# Patient Record
Sex: Male | Born: 1980 | Race: Black or African American | Hispanic: No | Marital: Single | Smoking: Current every day smoker
Health system: Southern US, Community
[De-identification: ages and names within clinical notes are randomized; demographics above are authoritative.]

## PROBLEM LIST (undated history)

## (undated) DIAGNOSIS — I10 Essential (primary) hypertension: Secondary | ICD-10-CM

---

## 2009-03-31 ENCOUNTER — Emergency Department: Payer: Self-pay | Admitting: Unknown Physician Specialty

## 2011-12-21 ENCOUNTER — Emergency Department: Payer: Self-pay | Admitting: Emergency Medicine

## 2011-12-21 LAB — BASIC METABOLIC PANEL
BUN: 18 mg/dL (ref 7–18)
Calcium, Total: 10.2 mg/dL — ABNORMAL HIGH (ref 8.5–10.1)
Chloride: 103 mmol/L (ref 98–107)
Co2: 22 mmol/L (ref 21–32)
Creatinine: 2.15 mg/dL — ABNORMAL HIGH (ref 0.60–1.30)
EGFR (African American): 46 — ABNORMAL LOW
EGFR (Non-African Amer.): 40 — ABNORMAL LOW
Potassium: 3.5 mmol/L (ref 3.5–5.1)
Sodium: 138 mmol/L (ref 136–145)

## 2011-12-21 LAB — CK: CK, Total: 553 U/L — ABNORMAL HIGH (ref 35–232)

## 2011-12-21 LAB — CBC WITH DIFFERENTIAL/PLATELET
Basophil #: 0 10*3/uL (ref 0.0–0.1)
Eosinophil %: 0 %
HCT: 45.2 % (ref 40.0–52.0)
Lymphocyte #: 0.9 10*3/uL — ABNORMAL LOW (ref 1.0–3.6)
MCH: 28.6 pg (ref 26.0–34.0)
MCHC: 33.1 g/dL (ref 32.0–36.0)
MCV: 86 fL (ref 80–100)
Monocyte %: 5.7 %
Neutrophil #: 15.8 10*3/uL — ABNORMAL HIGH (ref 1.4–6.5)
RBC: 5.23 10*6/uL (ref 4.40–5.90)
RDW: 14.1 % (ref 11.5–14.5)
WBC: 17.8 10*3/uL — ABNORMAL HIGH (ref 3.8–10.6)

## 2012-11-29 ENCOUNTER — Emergency Department: Payer: Self-pay | Admitting: Emergency Medicine

## 2012-12-08 ENCOUNTER — Emergency Department: Payer: Self-pay | Admitting: Emergency Medicine

## 2015-04-26 ENCOUNTER — Emergency Department
Admission: EM | Admit: 2015-04-26 | Discharge: 2015-04-26 | Disposition: A | Discharge status: Home / Self Care | Payer: BLUE CROSS/BLUE SHIELD | Attending: Emergency Medicine | Admitting: Emergency Medicine

## 2015-04-26 ENCOUNTER — Encounter: Payer: Self-pay | Admitting: Emergency Medicine

## 2015-04-26 DIAGNOSIS — F17210 Nicotine dependence, cigarettes, uncomplicated: Secondary | ICD-10-CM | POA: Diagnosis not present

## 2015-04-26 DIAGNOSIS — R05 Cough: Secondary | ICD-10-CM | POA: Diagnosis present

## 2015-04-26 DIAGNOSIS — J111 Influenza due to unidentified influenza virus with other respiratory manifestations: Secondary | ICD-10-CM | POA: Diagnosis not present

## 2015-04-26 MED ORDER — IBUPROFEN 800 MG PO TABS
800.0000 mg | ORAL_TABLET | Freq: Three times a day (TID) | ORAL | Status: DC | PRN
Start: 2015-04-26 — End: 2018-04-22

## 2015-04-26 MED ORDER — PROMETHAZINE-CODEINE 6.25-10 MG/5ML PO SYRP: 7.5000 mL | ORAL_SOLUTION | Freq: Four times a day (QID) | ORAL | Status: DC | PRN

## 2015-04-26 NOTE — ED Provider Notes (Signed)
[facility] Emergency Department Provider Note  ____________________________________________  Time seen: Approximately 9:19 AM  I have reviewed the triage vital signs and the nursing notes.   HISTORY  Chief Complaint Generalized Body Aches; Cough; Sore Throat; and Nasal Congestion    HPI Antonio Mcclain is a 34 y.o. male who presents to the emergency department for evaluation of sore throat, body aches, cough, and chills since Friday evening. He has been taking Robitussin without relief.   History reviewed. No pertinent past medical history.  There are no active problems to display for this patient.   History reviewed. No pertinent past surgical history.  Current Outpatient Rx  Name  Route  Sig  Dispense  Refill  . ibuprofen (ADVIL,MOTRIN) 800 MG tablet   Oral   Take 1 tablet (800 mg total) by mouth every 8 (eight) hours as needed.   30 tablet   0   . promethazine-codeine (PHENERGAN WITH CODEINE) 6.25-10 MG/5ML syrup   Oral   Take 7.5 mLs by mouth every 6 (six) hours as needed for cough.   120 mL   0     Allergies Coconut oil and Tramadol  No family history on file.  Social History Social History  Substance Use Topics  . Smoking status: Current Every Day Smoker -- 0.50 packs/day for 16 years    Types: Cigarettes  . Smokeless tobacco: None  . Alcohol Use: Yes     Comment: occasionally    Review of Systems Constitutional: Positive for fever/chills Eyes: No visual changes. ENT: Positive for sore throat. Cardiovascular: Denies chest pain. Respiratory: Denies shortness of breath. Gastrointestinal: No abdominal pain.  Nausea, no vomiting. Diarrhea yesterday.  No constipation. Genitourinary: Negative for dysuria. Musculoskeletal: Positive for body aches Skin: Negative for rash. Neurological: Negative for headaches, focal weakness or numbness.  10-point ROS otherwise  negative.  ____________________________________________   PHYSICAL EXAM:  VITAL SIGNS: ED Triage Vitals  Enc Vitals Group     BP 04/26/15 0827 134/95 mmHg     Pulse Rate 04/26/15 0827 79     Resp 04/26/15 0827 20     Temp 04/26/15 0827 98.4 F (36.9 C)     Temp Source 04/26/15 0827 Oral     SpO2 04/26/15 0827 99 %     Weight 04/26/15 0827 185 lb (83.915 kg)     Height 04/26/15 0827 6\' 3"  (1.905 m)     Head Cir --      Peak Flow --      Pain Score 04/26/15 0829 9     Pain Loc --      Pain Edu? --      Excl. in GC? --     Constitutional: Alert and oriented. Well appearing and in no acute distress. Eyes: Conjunctivae are normal. PERRL. EOMI. Head: Atraumatic. Nose: No congestion/rhinnorhea. Mouth/Throat: Mucous membranes are moist.  Oropharynx erythematous. Neck: No stridor.   Cardiovascular: Normal rate, regular rhythm. Grossly normal heart sounds.  Good peripheral circulation. Respiratory: Normal respiratory effort.  No retractions. Lungs CTAB. Gastrointestinal: Soft and nontender. No distention. No abdominal bruits. No CVA tenderness. Musculoskeletal: No lower extremity tenderness nor edema.  No joint effusions.  Neurologic:  Normal speech and language. No gross focal neurologic deficits are appreciated. No gait instability. Skin:  Skin is warm, dry and intact. No rash noted. Diaphoretic. Psychiatric: Mood and affect are normal. Speech and behavior are normal.  ____________________________________________   LABS (all labs ordered are listed, but only abnormal results are  displayed)  Labs Reviewed - No data to display ____________________________________________  EKG   ____________________________________________  RADIOLOGY   ____________________________________________   PROCEDURES  Procedure(s) performed: None  Critical Care performed: No  ____________________________________________   INITIAL IMPRESSION / ASSESSMENT AND PLAN / ED  COURSE  Pertinent labs & imaging results that were available during my care of the patient were reviewed by me and considered in my medical decision making (see chart for details).  Patient advised to follow up with PCP for symptoms that are not improving over the week. He was advised to return to the ER for symptoms that change or worsen if unable to schedule an appointment. ____________________________________________   FINAL CLINICAL IMPRESSION(S) / ED DIAGNOSES  Final diagnoses:  Influenza      Chinita Pester, FNP 04/26/15 [id number]  Emily Filbert, MD 04/26/15 1322

## 2015-04-26 NOTE — ED Notes (Signed)
Complaining of sore throat, upper body aches, brownish-green mucus coughing up. Wearing face mask.

## 2015-04-26 NOTE — Discharge Instructions (Signed)

## 2015-07-12 ENCOUNTER — Encounter: Payer: Self-pay | Admitting: Emergency Medicine

## 2015-07-12 ENCOUNTER — Emergency Department
Admission: EM | Admit: 2015-07-12 | Discharge: 2015-07-12 | Disposition: A | Discharge status: Home / Self Care | Payer: BLUE CROSS/BLUE SHIELD | Attending: Student | Admitting: Student

## 2015-07-12 DIAGNOSIS — H6123 Impacted cerumen, bilateral: Secondary | ICD-10-CM

## 2015-07-12 DIAGNOSIS — F17210 Nicotine dependence, cigarettes, uncomplicated: Secondary | ICD-10-CM | POA: Insufficient documentation

## 2015-07-12 MED ORDER — OXYCODONE-ACETAMINOPHEN 5-325 MG PO TABS
1.0000 | ORAL_TABLET | Freq: Once | ORAL | Status: AC
  Administered 2015-07-12: 1 via ORAL
  Filled 2015-07-12: qty 1

## 2015-07-12 MED ORDER — IBUPROFEN 800 MG PO TABS
800.0000 mg | ORAL_TABLET | Freq: Once | ORAL | Status: AC
  Administered 2015-07-12: 800 mg via ORAL
  Filled 2015-07-12: qty 1

## 2015-07-12 NOTE — ED Provider Notes (Signed)
[facility] Emergency Department Provider Note  ____________________________________________  Time seen: Approximately 9:18 PM  I have reviewed the triage vital signs and the nursing notes.   HISTORY  Chief Complaint Otalgia    HPI Antonio Mcclain is a 35 y.o. male patient complaining of bilateral ear pain left greater than right which started approximately 3/2 hours ago. Further history shows that the patient had one week of mild pain with decreased hearing loss. Patient state he used Q-tips to clean the ears about a week ago. Patient denies any URI signs symptoms. No palliative measures taken for this complaint. Patient is rating the pain as a 10 over 10.  History reviewed. No pertinent past medical history.  There are no active problems to display for this patient.   History reviewed. No pertinent past surgical history.  Current Outpatient Rx  Name  Route  Sig  Dispense  Refill  . ibuprofen (ADVIL,MOTRIN) 800 MG tablet   Oral   Take 1 tablet (800 mg total) by mouth every 8 (eight) hours as needed.   30 tablet   0   . promethazine-codeine (PHENERGAN WITH CODEINE) 6.25-10 MG/5ML syrup   Oral   Take 7.5 mLs by mouth every 6 (six) hours as needed for cough.   120 mL   0     Allergies Coconut oil and Tramadol  No family history on file.  Social History Social History  Substance Use Topics  . Smoking status: Current Every Day Smoker -- 0.50 packs/day for 16 years    Types: Cigarettes  . Smokeless tobacco: None  . Alcohol Use: Yes     Comment: occasionally    Review of Systems Constitutional: No fever/chills Eyes: No visual changes. ENT: No sore throat. Bilateral ear pain left greater than right Cardiovascular: Denies chest pain. Respiratory: Denies shortness of breath. Gastrointestinal: No abdominal pain.  No nausea, no vomiting.  No diarrhea.  No constipation. Genitourinary: Negative for dysuria. Musculoskeletal: Negative for back  pain. Skin: Negative for rash. Neurological: Negative for headaches, focal weakness or numbness. 10-point ROS otherwise negative.  ____________________________________________   PHYSICAL EXAM:  VITAL SIGNS: ED Triage Vitals  Enc Vitals Group     BP 07/12/15 2056 148/90 mmHg     Pulse Rate 07/12/15 2056 76     Resp 07/12/15 2056 16     Temp 07/12/15 2056 98.7 F (37.1 C)     Temp Source 07/12/15 2056 Oral     SpO2 07/12/15 2056 99 %     Weight 07/12/15 2056 185 lb (83.915 kg)     Height 07/12/15 2056  (1.88 m)     Head Cir --      Peak Flow --      Pain Score 07/12/15 2056 10     Pain Loc --      Pain Edu? --      Excl. in GC? --     Constitutional: Alert and oriented. Well appearing and in no acute distress. Eyes: Conjunctivae are normal. PERRL. EOMI. Head: Atraumatic. Nose: No congestion/rhinnorhea. Mouth/Throat: Mucous membranes are moist.  Oropharynx non-erythematous.  EAR: Bilateral ears feel of wax TM is not visible. Neck: No stridor.  No cervical spine tenderness to palpation. Hematological/Lymphatic/Immunilogical: No cervical lymphadenopathy. Cardiovascular: Normal rate, regular rhythm. Grossly normal heart sounds.  Good peripheral circulation. Respiratory: Normal respiratory effort.  No retractions. Lungs CTAB. Gastrointestinal: Soft and nontender. No distention. No abdominal bruits. No CVA tenderness. Musculoskeletal: No lower extremity tenderness nor edema.  No joint effusions. Neurologic:  Normal speech and language. No gross focal neurologic deficits are appreciated. No gait instability. Skin:  Skin is warm, dry and intact. No rash noted. Psychiatric: Mood and affect are normal. Speech and behavior are normal.  ____________________________________________   LABS (all labs ordered are listed, but only abnormal results are displayed)  Labs Reviewed - No data to  display ____________________________________________  EKG   ____________________________________________  RADIOLOGY   ____________________________________________   PROCEDURES  Procedure(s) performed: None  Critical Care performed: No  ____________________________________________   INITIAL IMPRESSION / ASSESSMENT AND PLAN / ED COURSE  Pertinent labs & imaging results that were available during my care of the patient were reviewed by me and considered in my medical decision making (see chart for details).  Cerumen impaction. Patient is discharged care instructions. Patient [facility] ____________________________________________   FINAL CLINICAL IMPRESSION(S) / ED DIAGNOSES  Final diagnoses:  Cerumen impaction, bilateral      Joni Reining, PA-C 07/12/15 2218  Gayla Doss, MD 07/13/15 [id number]

## 2015-07-12 NOTE — ED Notes (Signed)
Pt states around 1800 pain began in left ear and is throbbing and tingling thought left side of head. Denies fever. Pt A&O

## 2015-07-12 NOTE — Discharge Instructions (Signed)
Cerumen Impaction The structures of the external ear canal secrete a waxy substance known as cerumen. Excess cerumen can build up in the ear canal, causing a condition known as cerumen impaction. Cerumen impaction can cause ear pain and disrupt the function of the ear. The rate of cerumen production differs for each individual. In certain individuals, the configuration of the ear canal may decrease his or her ability to naturally remove cerumen. CAUSES Cerumen impaction is caused by excessive cerumen production or buildup. RISK FACTORS  Frequent use of swabs to clean ears.  Having narrow ear canals.  Having eczema.  Being dehydrated. SIGNS AND SYMPTOMS  Diminished hearing.  Ear drainage.  Ear pain.  Ear itch. TREATMENT Treatment may involve:  Over-the-counter or prescription ear drops to soften the cerumen.  Removal of cerumen by a health care provider. This may be done with:  Irrigation with warm water. This is the most common method of removal.  Ear curettes and other instruments.  Surgery. This may be done in severe cases. HOME CARE INSTRUCTIONS  Take medicines only as directed by your health care provider.  Do not insert objects into the ear with the intent of cleaning the ear. PREVENTION  Do not insert objects into the ear, even with the intent of cleaning the ear. Removing cerumen as a part of normal hygiene is not necessary, and the use of swabs in the ear canal is not recommended.  Drink enough water to keep your urine clear or pale yellow.  Control your eczema if you have it. SEEK MEDICAL CARE IF:  You develop ear pain.  You develop bleeding from the ear.  The cerumen does not clear after you use ear drops as directed.   This information is not intended to replace advice given to you by your health care provider. Make sure you discuss any questions you have with your health care provider.   Document Released: 06/29/2004 Document Revised: 06/12/2014  Document Reviewed: 01/06/2015 Elsevier Interactive Patient Education 2016 [employer] Inc[employer]  

## 2015-07-12 NOTE — ED Notes (Signed)
Pt c/o otalgia in left ear that started around 1800.Pain radiates throughout left side of head with tingling and throbbing. Pt denies any fever . Pt A&O

## 2015-11-02 ENCOUNTER — Emergency Department
Admission: EM | Admit: 2015-11-02 | Discharge: 2015-11-02 | Disposition: A | Discharge status: Home / Self Care | Payer: PRIVATE HEALTH INSURANCE | Attending: Emergency Medicine | Admitting: Emergency Medicine

## 2015-11-02 ENCOUNTER — Encounter: Payer: Self-pay | Admitting: Medical Oncology

## 2015-11-02 DIAGNOSIS — Z791 Long term (current) use of non-steroidal anti-inflammatories (NSAID): Secondary | ICD-10-CM | POA: Diagnosis not present

## 2015-11-02 DIAGNOSIS — J069 Acute upper respiratory infection, unspecified: Secondary | ICD-10-CM | POA: Diagnosis not present

## 2015-11-02 DIAGNOSIS — Z79899 Other long term (current) drug therapy: Secondary | ICD-10-CM | POA: Insufficient documentation

## 2015-11-02 DIAGNOSIS — R05 Cough: Secondary | ICD-10-CM | POA: Diagnosis present

## 2015-11-02 DIAGNOSIS — F17210 Nicotine dependence, cigarettes, uncomplicated: Secondary | ICD-10-CM | POA: Insufficient documentation

## 2015-11-02 MED ORDER — IPRATROPIUM-ALBUTEROL 0.5-2.5 (3) MG/3ML IN SOLN
3.0000 mL | Freq: Once | RESPIRATORY_TRACT | Status: AC
  Administered 2015-11-02: 3 mL via RESPIRATORY_TRACT
  Filled 2015-11-02: qty 3

## 2015-11-02 NOTE — ED Notes (Signed)
Pt reports cold symptoms and cough with chest congestion since Thursday. Productive cough.

## 2015-11-02 NOTE — Discharge Instructions (Signed)
Upper Respiratory Infection, Adult Most upper respiratory infections (URIs) are a viral infection of the air passages leading to the lungs. A URI affects the nose, throat, and upper air passages. The most common type of URI is nasopharyngitis and is typically referred to as "the common cold." URIs run their course and usually go away on their own. Most of the time, a URI does not require medical attention, but sometimes a bacterial infection in the upper airways can follow a viral infection. This is called a secondary infection. Sinus and middle ear infections are common types of secondary upper respiratory infections. Bacterial pneumonia can also complicate a URI. A URI can worsen asthma and chronic obstructive pulmonary disease (COPD). Sometimes, these complications can require emergency medical care and may be life threatening.  CAUSES Almost all URIs are caused by viruses. A virus is a type of germ and can spread from one person to another.  RISKS FACTORS You may be at risk for a URI if:   You smoke.   You have chronic heart or lung disease.  You have a weakened defense (immune) system.   You are very young or very old.   You have nasal allergies or asthma.  You work in crowded or poorly ventilated areas.  You work in health care facilities or schools. SIGNS AND SYMPTOMS  Symptoms typically develop 2-3 days after you come in contact with a cold virus. Most viral URIs last 7-10 days. However, viral URIs from the influenza virus (flu virus) can last 14-18 days and are typically more severe. Symptoms may include:   Runny or stuffy (congested) nose.   Sneezing.   Cough.   Sore throat.   Headache.   Fatigue.   Fever.   Loss of appetite.   Pain in your forehead, behind your eyes, and over your cheekbones (sinus pain).  Muscle aches.  DIAGNOSIS  Your health care provider may diagnose a URI by:  Physical exam.  Tests to check that your symptoms are not due to  another condition such as:  Strep throat.  Sinusitis.  Pneumonia.  Asthma. TREATMENT  A URI goes away on its own with time. It cannot be cured with medicines, but medicines may be prescribed or recommended to relieve symptoms. Medicines may help:  Reduce your fever.  Reduce your cough.  Relieve nasal congestion. HOME CARE INSTRUCTIONS   Take medicines only as directed by your health care provider.   Gargle warm saltwater or take cough drops to comfort your throat as directed by your health care provider.  Use a warm mist humidifier or inhale steam from a shower to increase air moisture. This may make it easier to breathe.  Drink enough fluid to keep your urine clear or pale yellow.   Eat soups and other clear broths and maintain good nutrition.   Rest as needed.   Return to work when your temperature has returned to normal or as your health care provider advises. You may need to stay home longer to avoid infecting others. You can also use a face mask and careful hand washing to prevent spread of the virus.  Increase the usage of your inhaler if you have asthma.   Do not use any tobacco products, including cigarettes, chewing tobacco, or electronic cigarettes. If you need help quitting, ask your health care provider. PREVENTION  The best way to protect yourself from getting a cold is to practice good hygiene.   Avoid oral or hand contact with people with cold   symptoms.   Wash your hands often if contact occurs.  There is no clear evidence that vitamin C, vitamin E, echinacea, or exercise reduces the chance of developing a cold. However, it is always recommended to get plenty of rest, exercise, and practice good nutrition.  SEEK MEDICAL CARE IF:   You are getting worse rather than better.   Your symptoms are not controlled by medicine.   You have chills.  You have worsening shortness of breath.  You have brown or red mucus.  You have yellow or brown nasal  discharge.  You have pain in your face, especially when you bend forward.  You have a fever.  You have swollen neck glands.  You have pain while swallowing.  You have white areas in the back of your throat. SEEK IMMEDIATE MEDICAL CARE IF:   You have severe or persistent:  Headache.  Ear pain.  Sinus pain.  Chest pain.  You have chronic lung disease and any of the following:  Wheezing.  Prolonged cough.  Coughing up blood.  A change in your usual mucus.  You have a stiff neck.  You have changes in your:  Vision.  Hearing.  Thinking.  Mood. MAKE SURE YOU:   Understand these instructions.  Will watch your condition.  Will get help right away if you are not doing well or get worse.   This information is not intended to replace advice given to you by your health care provider. Make sure you discuss any questions you have with your health care provider.   Document Released: 11/15/2000 Document Revised: 10/06/2014 Document Reviewed: 08/27/2013 Elsevier Interactive Patient Education 2016 Elsevier Inc.  

## 2015-11-02 NOTE — ED Notes (Signed)
See triage   Cold .cough and congestion for about 4 days    States cough is prod  And having some discomfort to chest with cough  Afebrile on arrival

## 2015-11-04 NOTE — ED Provider Notes (Signed)
[facility] Emergency Department Provider Note  ____________________________________________  Time seen: On arrival  I have reviewed the triage vital signs and the nursing notes.   HISTORY  Chief Complaint URI and Cough    HPI EUAL LINDSTROM is a 35 y.o. male who presents with complaints of congestion, cough, fatigue for approximately 3-4 days. He does report a productive cough. He denies chest pain to me. He denies shortness of breath to me. He does smoke but reports she is trying to quit. No recent travel. No calf pain or swelling. No pleurisy.    History reviewed. No pertinent past medical history.  There are no active problems to display for this patient.   History reviewed. No pertinent past surgical history.  Current Outpatient Rx  Name  Route  Sig  Dispense  Refill  . ibuprofen (ADVIL,MOTRIN) 800 MG tablet   Oral   Take 1 tablet (800 mg total) by mouth every 8 (eight) hours as needed.   30 tablet   0   . promethazine-codeine (PHENERGAN WITH CODEINE) 6.25-10 MG/5ML syrup   Oral   Take 7.5 mLs by mouth every 6 (six) hours as needed for cough.   120 mL   0     Allergies Coconut oil and Tramadol  No family history on file.  Social History Social History  Substance Use Topics  . Smoking status: Current Every Day Smoker -- 0.50 packs/day for 16 years    Types: Cigarettes  . Smokeless tobacco: None  . Alcohol Use: Yes     Comment: occasionally    Review of Systems  Constitutional: Subjective fevers Eyes: Negative for visual changes. ENT: Negative for sore throat    Musculoskeletal: Negative for back pain. No calf pain or swelling Skin: Negative for rash. Neurological: Negative for headaches or focal weakness   ____________________________________________   PHYSICAL EXAM:  VITAL SIGNS: ED Triage Vitals  Enc Vitals Group     BP 11/02/15 0934 140/94 mmHg     Pulse Rate 11/02/15 0934 69     Resp 11/02/15 0934 18     Temp 11/02/15 0934 98.4 F (36.9 C)     Temp Source 11/02/15 0934 Oral     SpO2 11/02/15 0934 100 %     Weight 11/02/15 0934 185 lb (83.915 kg)     Height 11/02/15 0934  (1.905 m)     Head Cir --      Peak Flow --      Pain Score 11/02/15 0938 9     Pain Loc --      Pain Edu? --      Excl. in GC? --     Constitutional: Alert and oriented. Well appearing and in no distress. Eyes: Conjunctivae are normal.  ENT   Head: Normocephalic and atraumatic.   Mouth/Throat: Mucous membranes are moist. Cardiovascular: Normal rate, regular rhythm.  Respiratory: Normal respiratory effort without tachypnea nor retractions.  Gastrointestinal: Soft and non-tender in all quadrants. No distention. There is no CVA tenderness. Musculoskeletal: Nontender with normal range of motion in all extremities. Neurologic:  Normal speech and language. No gross focal neurologic deficits are appreciated. Skin:  Skin is warm, dry and intact. No rash noted. Psychiatric: Mood and affect are normal. Patient exhibits appropriate insight and judgment.  ____________________________________________    LABS (pertinent positives/negatives)  Labs Reviewed - No data to display  ____________________________________________     ____________________________________________    RADIOLOGY I have personally reviewed any xrays that were  ordered on this patient: Non  ____________________________________________   PROCEDURES  Procedure(s) performed: none   ____________________________________________   INITIAL IMPRESSION / ASSESSMENT AND PLAN / ED COURSE  Pertinent labs & imaging results that were available during my care of the patient were reviewed by me and considered in my medical decision making (see chart for details).  Patient is well-appearing and in no distress. His exam is benign. History of present illness is most consistent with viral upper respiratory infection. Recommend supportive  care and smoking cessation. Recommend outpatient PCP follow-up as needed if no improvement within one week. Return precautions discussed.  ____________________________________________   FINAL CLINICAL IMPRESSION(S) / ED DIAGNOSES  Final diagnoses:  Upper respiratory infection     Jene Everyobert Santia Labate, MD 11/04/15 [phone]

## 2015-12-01 DIAGNOSIS — F17210 Nicotine dependence, cigarettes, uncomplicated: Secondary | ICD-10-CM | POA: Insufficient documentation

## 2015-12-01 DIAGNOSIS — L02213 Cutaneous abscess of chest wall: Secondary | ICD-10-CM | POA: Insufficient documentation

## 2015-12-02 ENCOUNTER — Emergency Department
Admission: EM | Admit: 2015-12-02 | Discharge: 2015-12-02 | Disposition: A | Discharge status: Home / Self Care | Payer: BLUE CROSS/BLUE SHIELD | Attending: Emergency Medicine | Admitting: Emergency Medicine

## 2015-12-02 ENCOUNTER — Encounter: Payer: Self-pay | Admitting: Emergency Medicine

## 2015-12-02 DIAGNOSIS — J869 Pyothorax without fistula: Secondary | ICD-10-CM

## 2015-12-02 MED ORDER — BACITRACIN ZINC 500 UNIT/GM EX OINT
TOPICAL_OINTMENT | Freq: Every day | CUTANEOUS | Status: AC
  Administered 2015-12-02: 1 via TOPICAL

## 2015-12-02 MED ORDER — OXYCODONE-ACETAMINOPHEN 5-325 MG PO TABS
ORAL_TABLET | ORAL | Status: AC
  Administered 2015-12-02: 1 via ORAL
  Filled 2015-12-02: qty 1

## 2015-12-02 MED ORDER — CEPHALEXIN 500 MG PO CAPS: 500.0000 mg | ORAL_CAPSULE | Freq: Two times a day (BID) | ORAL | Status: AC

## 2015-12-02 MED ORDER — OXYCODONE-ACETAMINOPHEN 5-325 MG PO TABS
1.0000 | ORAL_TABLET | Freq: Once | ORAL | Status: AC
  Administered 2015-12-02: 1 via ORAL

## 2015-12-02 MED ORDER — OXYCODONE-ACETAMINOPHEN 5-325 MG PO TABS: 1.0000 | ORAL_TABLET | ORAL | Status: DC | PRN

## 2015-12-02 MED ORDER — CEPHALEXIN 500 MG PO CAPS
500.0000 mg | ORAL_CAPSULE | Freq: Once | ORAL | Status: AC
  Administered 2015-12-02: 500 mg via ORAL
  Filled 2015-12-02: qty 1

## 2015-12-02 MED ORDER — LIDOCAINE HCL (PF) 1 % IJ SOLN
5.0000 mL | Freq: Once | INTRAMUSCULAR | Status: AC
  Administered 2015-12-02: 02:00:00 via INTRADERMAL

## 2015-12-02 MED ORDER — BACITRACIN ZINC 500 UNIT/GM EX OINT
TOPICAL_OINTMENT | CUTANEOUS | Status: AC
  Administered 2015-12-02: 1 via TOPICAL
  Filled 2015-12-02: qty 0.9

## 2015-12-02 MED ORDER — LIDOCAINE HCL (PF) 1 % IJ SOLN
INTRAMUSCULAR | Status: AC
Start: 2015-12-02 — End: 2015-12-02
  Filled 2015-12-02: qty 5

## 2015-12-02 NOTE — ED Provider Notes (Signed)
[facility] Emergency Department Provider Note  ____________________________________________  Time seen: 1:50 AM  I have reviewed the triage vital signs and the nursing notes.   HISTORY  Chief Complaint Abscess      HPI Antonio Mcclain is a 35 y.o. male presents with "abscess on his chest 5 days. Patient states the area was initially a pimple which he attempted to "pop". Following this the patient stated that the area became more swollen and tender to touch. Patient denies any fever   Past medical history No pertinent past medical history  There are no active problems to display for this patient.   Past surgical history No pertinent past surgical history  Current Outpatient Rx  Name  Route  Sig  Dispense  Refill  . ibuprofen (ADVIL,MOTRIN) 800 MG tablet   Oral   Take 1 tablet (800 mg total) by mouth every 8 (eight) hours as needed.   30 tablet   0   . promethazine-codeine (PHENERGAN WITH CODEINE) 6.25-10 MG/5ML syrup   Oral   Take 7.5 mLs by mouth every 6 (six) hours as needed for cough.   120 mL   0     Allergies Coconut oil and Tramadol  History reviewed. No pertinent family history.  Social History Social History  Substance Use Topics  . Smoking status: Current Every Day Smoker -- 0.50 packs/day for 16 years    Types: Cigarettes  . Smokeless tobacco: Never Used  . Alcohol Use: Yes     Comment: occasionally    Review of Systems  Constitutional: Negative for fever. Eyes: Negative for visual changes. ENT: Negative for sore throat. Cardiovascular: Negative for chest pain. Respiratory: Negative for shortness of breath. Gastrointestinal: Negative for abdominal pain, vomiting and diarrhea. Genitourinary: Negative for dysuria. Musculoskeletal: Negative for back pain. Skin: Negative for rash.Positive for chest abscess Neurological: Negative for headaches, focal weakness or numbness.   10-point ROS otherwise  negative.  ____________________________________________   PHYSICAL EXAM:  VITAL SIGNS: ED Triage Vitals  Enc Vitals Group     BP 12/02/15 0016 125/82 mmHg     Pulse Rate 12/02/15 0016 64     Resp 12/02/15 0016 18     Temp 12/02/15 0016 98.1 F (36.7 C)     Temp Source 12/02/15 0016 Oral     SpO2 12/02/15 0016 99 %     Weight 12/02/15 0016 185 lb (83.915 kg)     Height 12/02/15 0016 6\' 3"  (1.905 m)     Head Cir --      Peak Flow --      Pain Score 12/02/15 0017 8     Pain Loc --      Pain Edu? --      Excl. in GC? --      Constitutional: Alert and oriented. Well appearing and in no distress. Eyes: Conjunctivae are normal. PERRL. Normal extraocular movements. ENT   Head: Normocephalic and atraumatic.   Nose: No congestion/rhinnorhea.   Mouth/Throat: Mucous membranes are moist.   Neck: No stridor. Hematological/Lymphatic/Immunilogical: No cervical lymphadenopathy. Cardiovascular: Normal rate, regular rhythm. Normal and symmetric distal pulses are present in all extremities. No murmurs, rubs, or gallops. Respiratory: Normal respiratory effort without tachypnea nor retractions. Breath sounds are clear and equal bilaterally. No wheezes/rales/rhonchi. Gastrointestinal: Soft and nontender. No distention. There is no CVA tenderness. Genitourinary: deferred Musculoskeletal: Nontender with normal range of motion in all extremities. No joint effusions.  No lower extremity tenderness nor edema. Neurologic:  Normal speech  and language. No gross focal neurologic deficits are appreciated. Speech is normal.  Skin:  Skin is warm, dry and intact. No rash noted.One by one flocculent raised area noted at his xiphoid process Psychiatric: Mood and affect are normal. Speech and behavior are normal. Patient exhibits appropriate insight and judgment.     PROCEDURES  Procedure(s) performed:   Marland Kitchen.Marland Kitchen.Incision and Drainage Date/Time: 12/02/2015 2:20 AM Performed by: Darci CurrentBROWN, Patriot  N Authorized by: Darci CurrentBROWN, [place] N Consent: Verbal consent obtained. Risks and benefits: risks, benefits and alternatives were discussed Consent given by: patient Patient understanding: patient states understanding of the procedure being performed Test results: test results available and properly labeled Required items: required blood products, implants, devices, and special equipment available Patient identity confirmed: verbally with patient and arm band Time out: Immediately prior to procedure a "time out" was called to verify the correct patient, procedure, equipment, support staff and site/side marked as required. Type: abscess Anesthesia: local infiltration Local anesthetic: lidocaine 1% without epinephrine Patient sedated: no Scalpel size: 11 Needle gauge: 22 Incision type: single straight Incision depth: subcutaneous Complexity: complex Drainage: purulent Drainage amount: moderate Wound treatment: wound left open Patient tolerance: Patient tolerated the procedure well with no immediate complications      INITIAL IMPRESSION / ASSESSMENT AND PLAN / ED COURSE  Pertinent labs & imaging results that were available during my care of the patient were reviewed by me and considered in my medical decision making (see chart for details).  Patient given Keflex and prescribed the same  ____________________________________________   FINAL CLINICAL IMPRESSION(S) / ED DIAGNOSES  Final diagnoses:  Abscess of chest [facility](HCC)      Darci Currentandolph N Weber Monnier, MD 12/02/15 [phone]

## 2015-12-02 NOTE — ED Notes (Signed)
Pt presents to ED with possible abscess on his chest. Pt states the other day he did pop it and the next day it was painful and more swollen. Area is not draining currently.

## 2015-12-02 NOTE — ED Notes (Signed)
Dressing and bacitracin applied to center of chest after MD lanced abscess - per MD order

## 2016-12-28 ENCOUNTER — Encounter: Payer: Self-pay | Admitting: Emergency Medicine

## 2016-12-28 ENCOUNTER — Emergency Department: Payer: PRIVATE HEALTH INSURANCE

## 2016-12-28 ENCOUNTER — Emergency Department
Admission: EM | Admit: 2016-12-28 | Discharge: 2016-12-28 | Disposition: A | Discharge status: Home / Self Care | Payer: PRIVATE HEALTH INSURANCE | Attending: Emergency Medicine | Admitting: Emergency Medicine

## 2016-12-28 DIAGNOSIS — S46812A Strain of other muscles, fascia and tendons at shoulder and upper arm level, left arm, initial encounter: Secondary | ICD-10-CM | POA: Insufficient documentation

## 2016-12-28 DIAGNOSIS — Y929 Unspecified place or not applicable: Secondary | ICD-10-CM | POA: Insufficient documentation

## 2016-12-28 DIAGNOSIS — S29012A Strain of muscle and tendon of back wall of thorax, initial encounter: Secondary | ICD-10-CM

## 2016-12-28 DIAGNOSIS — X500XXA Overexertion from strenuous movement or load, initial encounter: Secondary | ICD-10-CM | POA: Insufficient documentation

## 2016-12-28 DIAGNOSIS — F17210 Nicotine dependence, cigarettes, uncomplicated: Secondary | ICD-10-CM | POA: Insufficient documentation

## 2016-12-28 DIAGNOSIS — Y9389 Activity, other specified: Secondary | ICD-10-CM | POA: Diagnosis not present

## 2016-12-28 DIAGNOSIS — M25512 Pain in left shoulder: Secondary | ICD-10-CM | POA: Diagnosis present

## 2016-12-28 DIAGNOSIS — Y998 Other external cause status: Secondary | ICD-10-CM | POA: Diagnosis not present

## 2016-12-28 MED ORDER — ORPHENADRINE CITRATE 30 MG/ML IJ SOLN
60.0000 mg | Freq: Two times a day (BID) | INTRAMUSCULAR | Status: DC
  Filled 2016-12-28: qty 2

## 2016-12-28 MED ORDER — CYCLOBENZAPRINE HCL 10 MG PO TABS
10.0000 mg | ORAL_TABLET | Freq: Three times a day (TID) | ORAL | 0 refills | Status: AC | PRN
Start: 2016-12-28 — End: 2017-01-02

## 2016-12-28 MED ORDER — CYCLOBENZAPRINE HCL 10 MG PO TABS
10.0000 mg | ORAL_TABLET | Freq: Once | ORAL | Status: AC
  Administered 2016-12-28: 10 mg via ORAL
  Filled 2016-12-28: qty 1

## 2016-12-28 NOTE — ED Triage Notes (Signed)
Chronic left shoulder pain, irritation frequently , utilizes repetitive movement at work. "it hurts to breath"

## 2016-12-29 NOTE — ED Provider Notes (Signed)
[facility] Emergency Department Provider Note  ____________________________________________  Time seen: Approximately 5:40 PM  I have reviewed the triage vital signs and the nursing notes.   HISTORY  Chief Complaint Shoulder Pain    HPI Antonio Mcclain is a 36 y.o. male presenting to the emergency department with spasms of the latissimus dorsi, left. Patient states that his pain is 10 out of 10 in intensity and worsened with deep inspiration and with heavy lifting. Patient states that his pain is reproducible with palpation. He denies weakness, changes in sensation in the upper or lower extremities. Patient denies falls or mechanisms of trauma. He denies chest pain, chest tightness, shortness of breath, nausea, vomiting or abdominal pain.  History reviewed. No pertinent past medical history.  There are no active problems to display for this patient.   History reviewed. No pertinent surgical history.  Prior to Admission medications   Medication Sig Start Date End Date Taking? Authorizing Provider  cyclobenzaprine (FLEXERIL) 10 MG tablet Take 1 tablet (10 mg total) by mouth 3 (three) times daily as needed for muscle spasms. 12/28/16 01/02/17  Orvil FeilWoods, Andrika Peraza M, PA-C  ibuprofen (ADVIL,MOTRIN) 800 MG tablet Take 1 tablet (800 mg total) by mouth every 8 (eight) hours as needed. 04/26/15   Triplett, Rulon Eisenmengerari B, FNP  oxyCODONE-acetaminophen (PERCOCET/ROXICET) 5-325 MG tablet Take 1 tablet by mouth every 4 (four) hours as needed for severe pain. 12/02/15   Darci CurrentBrown, Cornish N, MD  promethazine-codeine [facility](PHENERGAN WITH CODEINE) 6.25-10 MG/5ML syrup Take 7.5 mLs by mouth every 6 (six) hours as needed for cough. 04/26/15   Triplett, Rulon Eisenmengerari B, FNP    Allergies Coconut oil and Tramadol  No family history on file.  Social History Social History  Substance Use Topics  . Smoking status: Current Every Day Smoker    Packs/day: 0.50    Years: 16.00    Types: Cigarettes  .  Smokeless tobacco: Never Used  . Alcohol use Yes     Comment: occasionally     Review of Systems  Constitutional: No fever/chills Eyes: No visual changes. No discharge ENT: No upper respiratory complaints. Cardiovascular: no chest pain. Respiratory: no cough. No SOB. Gastrointestinal: No abdominal pain.  No nausea, no vomiting.  No diarrhea.  No constipation. Genitourinary: Negative for dysuria. No hematuria Musculoskeletal: Patient has left sided upper back pain. Skin: Negative for rash, abrasions, lacerations, ecchymosis. Neurological: Negative for headaches, focal weakness or numbness.   ____________________________________________   PHYSICAL EXAM:  VITAL SIGNS: ED Triage Vitals  Enc Vitals Group     BP 12/28/16 1821 (!) 143/91     Pulse Rate 12/28/16 1821 69     Resp 12/28/16 1821 18     Temp 12/28/16 1821 98.4 F (36.9 C)     Temp Source 12/28/16 1821 Oral     SpO2 12/28/16 1821 100 %     Weight 12/28/16 1822 185 lb (83.9 kg)     Height 12/28/16 1822 6\' 3"  (1.905 m)     Head Circumference --      Peak Flow --      Pain Score 12/28/16 1821 10     Pain Loc --      Pain Edu? --      Excl. in GC? --      Constitutional: Alert and oriented. Well appearing and in no acute distress. Eyes: Conjunctivae are normal. PERRL. EOMI. Head: Atraumatic. Cardiovascular: Normal rate, regular rhythm. Normal S1 and S2.  Good peripheral circulation. Respiratory: Normal respiratory  effort without tachypnea or retractions. Lungs CTAB. Good air entry to the bases with no decreased or absent breath sounds. Musculoskeletal: Patient has 5/5 strength in the upper and lower extremities bilaterally. Full range of motion at the shoulder, elbow and wrist bilaterally. Full range of motion at the hip, knee and ankle bilaterally. Patient has pain with palpation along the left latissimus dorsi with palpation. Palpable radial, ulnar and dorsalis pedis pulses bilaterally and symmetrically. No changes  in gait.   Neurologic:  Normal speech and language. No gross focal neurologic deficits are appreciated.  Skin:  Skin is warm, dry and intact. No rash noted. Psychiatric: Mood and affect are normal. Speech and behavior are normal. Patient exhibits appropriate insight and judgement.   ____________________________________________   LABS (all labs ordered are listed, but only abnormal results are displayed)  Labs Reviewed - No data to display ____________________________________________  EKG   ____________________________________________  RADIOLOGY Geraldo PitterI, Jaci Desanto M Aditya Nastasi, personally viewed and evaluated these images (plain radiographs) as part of my medical decision making, as well as reviewing the written report by the radiologist.  Dg Thoracic Spine 2 View  Result Date: 12/28/2016 CLINICAL DATA:  Left shoulder pain extending to the mid back and down to T12 since this morning at 12 a.m. Hurts to breathe. Patient lifts heavy packages at work. EXAM: THORACIC SPINE 2 VIEWS COMPARISON:  None. FINDINGS: There is no evidence of thoracic spine fracture. Alignment is normal. No other significant bone abnormalities are identified. IMPRESSION: Negative. Electronically Signed   By: Burman NievesWilliam  Stevens M.D.   On: 12/28/2016 19:05    ____________________________________________    PROCEDURES  Procedure(s) performed:    Procedures    Medications  cyclobenzaprine (FLEXERIL) tablet 10 mg (10 mg Oral Given 12/28/16 1941)     ____________________________________________   INITIAL IMPRESSION / ASSESSMENT AND PLAN / ED COURSE  Pertinent labs & imaging results that were available during my care of the patient were reviewed by me and considered in my medical decision making (see chart for details).  Review of the [place] CSRS was performed in accordance of the [facility] prior to dispensing any controlled drugs.    Assessment and plan Strain of latissimus dorsi muscle, initial encounter Patient presents  to the emergency department with reproducible pain to palpation along the left latissimus dorsi. Patient was given Flexeril in the emergency department. He was discharged with Flexeril and Mobic. DG thoracic spine revealed no bony abnormality. Patient was advised to follow-up with primary care as needed. Vital signs are reassuring prior to discharge.   ____________________________________________  FINAL CLINICAL IMPRESSION(S) / ED DIAGNOSES  Final diagnoses:  Strain of latissimus dorsi muscle, initial encounter      NEW MEDICATIONS STARTED DURING THIS VISIT:  Discharge Medication List as of 12/28/2016  8:03 PM    START taking these medications   Details  cyclobenzaprine (FLEXERIL) 10 MG tablet Take 1 tablet (10 mg total) by mouth 3 (three) times daily as needed for muscle spasms., Starting Thu 12/28/2016, Until Tue 01/02/2017, Print            This chart was dictated using voice recognition software/Dragon. Despite best efforts to proofread, errors can occur which can change the meaning. Any change was purely unintentional.    Orvil FeilWoods, Orlin Kann M, PA-C 12/29/16 1746    Sharyn CreamerQuale, Mark, MD 01/04/17 [phone]

## 2017-02-22 ENCOUNTER — Encounter: Payer: Self-pay | Admitting: Emergency Medicine

## 2017-02-22 ENCOUNTER — Emergency Department
Admission: EM | Admit: 2017-02-22 | Discharge: 2017-02-22 | Disposition: A | Discharge status: Home / Self Care | Payer: PRIVATE HEALTH INSURANCE | Attending: Emergency Medicine | Admitting: Emergency Medicine

## 2017-02-22 DIAGNOSIS — J329 Chronic sinusitis, unspecified: Secondary | ICD-10-CM | POA: Insufficient documentation

## 2017-02-22 DIAGNOSIS — B9689 Other specified bacterial agents as the cause of diseases classified elsewhere: Secondary | ICD-10-CM | POA: Insufficient documentation

## 2017-02-22 DIAGNOSIS — F17210 Nicotine dependence, cigarettes, uncomplicated: Secondary | ICD-10-CM | POA: Insufficient documentation

## 2017-02-22 DIAGNOSIS — J40 Bronchitis, not specified as acute or chronic: Secondary | ICD-10-CM | POA: Insufficient documentation

## 2017-02-22 MED ORDER — AMOXICILLIN-POT CLAVULANATE 875-125 MG PO TABS: 1.0000 | ORAL_TABLET | Freq: Two times a day (BID) | ORAL | 0 refills | Status: DC

## 2017-02-22 MED ORDER — PREDNISONE 50 MG PO TABS: 50.0000 mg | ORAL_TABLET | Freq: Every day | ORAL | 0 refills | Status: DC

## 2017-02-22 MED ORDER — PSEUDOEPH-BROMPHEN-DM 30-2-10 MG/5ML PO SYRP: 10.0000 mL | ORAL_SOLUTION | Freq: Four times a day (QID) | ORAL | 0 refills | Status: DC | PRN

## 2017-02-22 MED ORDER — ALBUTEROL SULFATE (2.5 MG/3ML) 0.083% IN NEBU
2.5000 mg | INHALATION_SOLUTION | Freq: Once | RESPIRATORY_TRACT | Status: AC
  Administered 2017-02-22: 2.5 mg via RESPIRATORY_TRACT
  Filled 2017-02-22: qty 3

## 2017-02-22 MED ORDER — FLUTICASONE PROPIONATE 50 MCG/ACT NA SUSP: 1.0000 | Freq: Two times a day (BID) | NASAL | 0 refills | Status: DC

## 2017-02-22 MED ORDER — AMOXICILLIN-POT CLAVULANATE 875-125 MG PO TABS
1.0000 | ORAL_TABLET | Freq: Once | ORAL | Status: AC
  Administered 2017-02-22: 1 via ORAL
  Filled 2017-02-22: qty 1

## 2017-02-22 MED ORDER — ALBUTEROL SULFATE HFA 108 (90 BASE) MCG/ACT IN AERS: 2.0000 | INHALATION_SPRAY | RESPIRATORY_TRACT | 0 refills | Status: DC | PRN

## 2017-02-22 MED ORDER — METHYLPREDNISOLONE SODIUM SUCC 125 MG IJ SOLR
125.0000 mg | Freq: Once | INTRAMUSCULAR | Status: AC
  Administered 2017-02-22: 125 mg via INTRAMUSCULAR
  Filled 2017-02-22: qty 2

## 2017-02-22 NOTE — ED Notes (Signed)
Reviewed discharge instructions, follow-up care, prescriptions with patient. Patient verbalized understanding.  

## 2017-02-22 NOTE — ED Triage Notes (Addendum)
Patient ambulatory to triage with steady gait, without difficulty or distress noted; pt reports since Tuesday having noncough, sinus congestion and pressure

## 2017-02-22 NOTE — ED Notes (Signed)
Pt c/o cough, congestion x 72 hrs - states cough, sinus pressure and congestion. Pt medicated as ordered and receiving albuterol tx.

## 2017-02-22 NOTE — ED Provider Notes (Signed)
[facility] Emergency Department Provider Note  ____________________________________________  Time seen: Approximately 10:32 PM  I have reviewed the triage vital signs and the nursing notes.   HISTORY  Chief Complaint Cough and Nasal Congestion    HPI Antonio Mcclain is a 36 y.o. male presents emergency department complaining of 3 days of nasal congestion, facial pressure, scratchy throat, coughing.Patient denies any history of repeated sinus infections, bronchitis or pneumonia. No fevers or chills. No difficulty breathing. Patient has been using multiple over-the-counter medications which helped somewhat but not alleviated symptoms. No complaints at this time.   History reviewed. No pertinent past medical history.  There are no active problems to display for this patient.   History reviewed. No pertinent surgical history.  Prior to Admission medications   Medication Sig Start Date End Date Taking? Authorizing Provider  albuterol (PROVENTIL HFA;VENTOLIN HFA) 108 (90 Base) MCG/ACT inhaler Inhale 2 puffs into the lungs every 4 (four) hours as needed for wheezing or shortness of breath. 02/22/17   Cuthriell, Delorise Royals, PA-C  amoxicillin-clavulanate (AUGMENTIN) 875-125 MG tablet Take 1 tablet by mouth 2 (two) times daily. 02/22/17   Cuthriell, Delorise Royals, PA-C  brompheniramine-pseudoephedrine-DM 30-2-10 MG/5ML syrup Take 10 mLs by mouth 4 (four) times daily as needed. 02/22/17   Cuthriell, Delorise Royals, PA-C  fluticasone (FLONASE) 50 MCG/ACT nasal spray Place 1 spray into both nostrils 2 (two) times daily. 02/22/17   Cuthriell, Delorise Royals, PA-C  ibuprofen (ADVIL,MOTRIN) 800 MG tablet Take 1 tablet (800 mg total) by mouth every 8 (eight) hours as needed. 04/26/15   Triplett, Rulon Eisenmenger B, FNP  oxyCODONE-acetaminophen (PERCOCET/ROXICET) 5-325 MG tablet Take 1 tablet by mouth every 4 (four) hours as needed for severe pain. 12/02/15   Darci Current, MD  predniSONE  (DELTASONE) 50 MG tablet Take 1 tablet (50 mg total) by mouth daily with breakfast. 02/22/17   Cuthriell, Delorise Royals, PA-C  promethazine-codeine (PHENERGAN WITH CODEINE) 6.25-10 MG/5ML syrup Take 7.5 mLs by mouth every 6 (six) hours as needed for cough. 04/26/15   Triplett, Rulon Eisenmenger B, FNP    Allergies Coconut oil and Tramadol  No family history on file.  Social History Social History  Substance Use Topics  . Smoking status: Current Every Day Smoker    Packs/day: 0.50    Years: 16.00    Types: Cigarettes  . Smokeless tobacco: Never Used  . Alcohol use Yes     Comment: occasionally     Review of Systems  Constitutional: No fever/chills Eyes: No visual changes. No discharge ENT: Positive for nasal congestion and sinus pressure. Cardiovascular: no chest pain. Respiratory: Positive for nonproductive cough. No SOB. Gastrointestinal: No abdominal pain.  No nausea, no vomiting.  Musculoskeletal: Negative for musculoskeletal pain. Skin: Negative for rash, abrasions, lacerations, ecchymosis. Neurological: Negative for headaches, focal weakness or numbness. 10-point ROS otherwise negative.  ____________________________________________   PHYSICAL EXAM:  VITAL SIGNS: ED Triage Vitals  Enc Vitals Group     BP 02/22/17 2226 (!) 142/94     Pulse Rate 02/22/17 2226 91     Resp 02/22/17 2226 20     Temp 02/22/17 2226 98.2 F (36.8 C)     Temp Source 02/22/17 2226 Oral     SpO2 02/22/17 2226 98 %     Weight 02/22/17 2228 185 lb (83.9 kg)     Height 02/22/17 2228  (1.905 m)     Head Circumference --      Peak Flow --  Pain Score 02/22/17 2227 10     Pain Loc --      Pain Edu? --      Excl. in GC? --      Constitutional: Alert and oriented. Well appearing and in no acute distress. Eyes: Conjunctivae are normal. PERRL. EOMI. Head: Atraumatic. ENT:      Ears: EACs and TMs are unremarkable bilaterally.      Nose: Significant purulent congestion/rhinnorhea. Patient is  tender with percussion over frontal and maxillary sinuses.      Mouth/Throat: Mucous membranes are moist. Oropharynx is nonerythematous and nonedematous. Tonsils are unremarkable. Uvula is midline. Neck: No stridor. Neck is supple with full range of motion Hematological/Lymphatic/Immunilogical: No cervical lymphadenopathy. Cardiovascular: Normal rate, regular rhythm. Normal S1 and S2.  Good peripheral circulation. Respiratory: Normal respiratory effort without tachypnea or retractions. Lungs with wheezes bilateral lower lung fields. No rales or rhonchi.Peri [place] air entry to the bases with no decreased or absent breath sounds. Musculoskeletal: Full range of motion to all extremities. No gross deformities appreciated. Neurologic:  Normal speech and language. No gross focal neurologic deficits are appreciated.  Skin:  Skin is warm, dry and intact. No rash noted. Psychiatric: Mood and affect are normal. Speech and behavior are normal. Patient exhibits appropriate insight and judgement.   ____________________________________________   LABS (all labs ordered are listed, but only abnormal results are displayed)  Labs Reviewed - No data to display ____________________________________________  EKG   ____________________________________________  RADIOLOGY   No results found.  ____________________________________________    PROCEDURES  Procedure(s) performed:    Procedures    Medications  amoxicillin-clavulanate (AUGMENTIN) 875-125 MG per tablet 1 tablet (1 tablet Oral Given 02/22/17 2248)  albuterol (PROVENTIL) (2.5 MG/3ML) 0.083% nebulizer solution 2.5 mg (2.5 mg Nebulization Given 02/22/17 2248)  methylPREDNISolone sodium succinate (SOLU-MEDROL) 125 mg/2 mL injection 125 mg (125 mg Intramuscular Given 02/22/17 2249)     ____________________________________________   INITIAL IMPRESSION / ASSESSMENT AND PLAN / ED COURSE  Pertinent labs & imaging results that were available  during my care of the patient were reviewed by me and considered in my medical decision making (see chart for details).  Review of the [place] CSRS was performed in accordance of the [facility] prior to dispensing any controlled drugs.     Patient's diagnosis is consistent with bacterial sinusitis and bronchitis. Patient is given albuterol, Solu-Medrol, first dose of antibiotic in the emergency department with improvement of symptoms.. Patient will be discharged home with prescriptions for Augmentin, Flonase, Proventil syrup, prednisone, albuterol inhaler. Patient is to follow up with primary care as needed or otherwise directed. Patient is given ED precautions to return to the ED for any worsening or new symptoms.     ____________________________________________  FINAL CLINICAL IMPRESSION(S) / ED DIAGNOSES  Final diagnoses:  Bacterial sinusitis  Bronchitis      NEW MEDICATIONS STARTED DURING THIS VISIT:  New Prescriptions   ALBUTEROL (PROVENTIL HFA;VENTOLIN HFA) 108 (90 BASE) MCG/ACT INHALER    Inhale 2 puffs into the lungs every 4 (four) hours as needed for wheezing or shortness of breath.   AMOXICILLIN-CLAVULANATE (AUGMENTIN) 875-125 MG TABLET    Take 1 tablet by mouth 2 (two) times daily.   BROMPHENIRAMINE-PSEUDOEPHEDRINE-DM 30-2-10 MG/5ML SYRUP    Take 10 mLs by mouth 4 (four) times daily as needed.   FLUTICASONE (FLONASE) 50 MCG/ACT NASAL SPRAY    Place 1 spray into both nostrils 2 (two) times daily.   PREDNISONE (DELTASONE) 50 MG TABLET  Take 1 tablet (50 mg total) by mouth daily with breakfast.        This chart was dictated using voice recognition software/Dragon. Despite best efforts to proofread, errors can occur which can change the meaning. Any change was purely unintentional.    Racheal Patches, PA-C 02/22/17 2304    Myrna Blazer, MD 02/22/17 [phone]

## 2018-04-22 ENCOUNTER — Encounter: Payer: Self-pay | Admitting: Emergency Medicine

## 2018-04-22 ENCOUNTER — Emergency Department
Admission: EM | Admit: 2018-04-22 | Discharge: 2018-04-22 | Disposition: A | Discharge status: Home / Self Care | Payer: BLUE CROSS/BLUE SHIELD | Attending: Emergency Medicine | Admitting: Emergency Medicine

## 2018-04-22 ENCOUNTER — Emergency Department: Payer: BLUE CROSS/BLUE SHIELD

## 2018-04-22 DIAGNOSIS — Y9389 Activity, other specified: Secondary | ICD-10-CM | POA: Insufficient documentation

## 2018-04-22 DIAGNOSIS — S46911A Strain of unspecified muscle, fascia and tendon at shoulder and upper arm level, right arm, initial encounter: Secondary | ICD-10-CM | POA: Diagnosis not present

## 2018-04-22 DIAGNOSIS — S4991XA Unspecified injury of right shoulder and upper arm, initial encounter: Secondary | ICD-10-CM | POA: Diagnosis present

## 2018-04-22 DIAGNOSIS — Z79899 Other long term (current) drug therapy: Secondary | ICD-10-CM | POA: Insufficient documentation

## 2018-04-22 DIAGNOSIS — Y990 Civilian activity done for income or pay: Secondary | ICD-10-CM | POA: Diagnosis not present

## 2018-04-22 DIAGNOSIS — Y9289 Other specified places as the place of occurrence of the external cause: Secondary | ICD-10-CM | POA: Diagnosis not present

## 2018-04-22 DIAGNOSIS — Y33XXXA Other specified events, undetermined intent, initial encounter: Secondary | ICD-10-CM | POA: Diagnosis not present

## 2018-04-22 DIAGNOSIS — S46811A Strain of other muscles, fascia and tendons at shoulder and upper arm level, right arm, initial encounter: Secondary | ICD-10-CM

## 2018-04-22 DIAGNOSIS — F17210 Nicotine dependence, cigarettes, uncomplicated: Secondary | ICD-10-CM | POA: Insufficient documentation

## 2018-04-22 MED ORDER — ORPHENADRINE CITRATE 30 MG/ML IJ SOLN
60.0000 mg | Freq: Two times a day (BID) | INTRAMUSCULAR | Status: DC
  Administered 2018-04-22: 60 mg via INTRAMUSCULAR

## 2018-04-22 MED ORDER — KETOROLAC TROMETHAMINE 30 MG/ML IJ SOLN
30.0000 mg | Freq: Once | INTRAMUSCULAR | Status: DC
  Filled 2018-04-22: qty 1

## 2018-04-22 MED ORDER — KETOROLAC TROMETHAMINE 30 MG/ML IJ SOLN
30.0000 mg | Freq: Once | INTRAMUSCULAR | Status: AC
  Administered 2018-04-22: 30 mg via INTRAMUSCULAR

## 2018-04-22 MED ORDER — ORPHENADRINE CITRATE 30 MG/ML IJ SOLN
60.0000 mg | Freq: Two times a day (BID) | INTRAMUSCULAR | Status: DC
  Filled 2018-04-22: qty 2

## 2018-04-22 MED ORDER — IBUPROFEN 600 MG PO TABS: 600.0000 mg | ORAL_TABLET | Freq: Four times a day (QID) | ORAL | 0 refills | Status: DC | PRN

## 2018-04-22 MED ORDER — LIDOCAINE 5 % EX PTCH
1.0000 | MEDICATED_PATCH | CUTANEOUS | Status: DC
  Administered 2018-04-22: 1 via TRANSDERMAL

## 2018-04-22 MED ORDER — LIDOCAINE 5 % EX PTCH
1.0000 | MEDICATED_PATCH | CUTANEOUS | Status: DC
  Filled 2018-04-22: qty 1

## 2018-04-22 MED ORDER — LIDOCAINE 5 % EX PTCH: 1.0000 | MEDICATED_PATCH | CUTANEOUS | 0 refills | Status: DC

## 2018-04-22 MED ORDER — CYCLOBENZAPRINE HCL 5 MG PO TABS: ORAL_TABLET | ORAL | 0 refills | Status: DC

## 2018-04-22 NOTE — ED Notes (Signed)
Pt ambulatory to POV without difficulty. VSS. NAD. Discharge instructions, RX and follow up reviewed. All questions and concerns addressed at this time.  

## 2018-04-22 NOTE — ED Triage Notes (Signed)
Pt reports pain to his right shoulder Sunday. Pt reports pain is severe when he tries to lift his arm past half way. Pt reports pain takes his breath when he raises it.

## 2018-04-22 NOTE — ED Provider Notes (Signed)
[facility] Emergency Department Provider Note  ____________________________________________  Time seen: Approximately 1:57 PM  I have reviewed the triage vital signs and the nursing notes.   HISTORY  Chief Complaint Shoulder Injury    HPI Antonio Mcclain Mood is a 37 y.o. male that presents to the emergency department for evaluation of right shoulder and neck pain for 2 days. He noticed pain when he woke up. Pain is worse with movement and deep breaths. 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 His job is very physical and he lifts 85 to 125 pounds at a time.Marland Kitchen. He has had bursitis in this shoulder several years ago. He has taken flexeril for muscle spasms in the past, which have worked well. No trauma.    History reviewed. No pertinent past medical history.  There are no active problems to display for this patient.   History reviewed. No pertinent surgical history.  Prior to Admission medications   Medication Sig Start Date End Date Taking? Authorizing Provider  albuterol (PROVENTIL HFA;VENTOLIN HFA) 108 (90 Base) MCG/ACT inhaler Inhale 2 puffs into the lungs every 4 (four) hours as needed for wheezing or shortness of breath. 02/22/17   Cuthriell, Delorise RoyalsJonathan D, PA-C  amoxicillin-clavulanate (AUGMENTIN) 875-125 MG tablet Take 1 tablet by mouth 2 (two) times daily. 02/22/17   Cuthriell, Delorise RoyalsJonathan D, PA-C  brompheniramine-pseudoephedrine-DM 30-2-10 MG/5ML syrup Take 10 mLs by mouth 4 (four) times daily as needed. 02/22/17   Cuthriell, Delorise RoyalsJonathan D, PA-C  cyclobenzaprine (FLEXERIL) 5 MG tablet Take 1-2 tablets 3 times daily as needed 04/22/18   Enid DerryWagner, Birdena Kingma, PA-C  fluticasone [facility](FLONASE) 50 MCG/ACT nasal spray Place 1 spray into both nostrils 2 (two) times daily. 02/22/17   Cuthriell, Delorise RoyalsJonathan D, PA-C  ibuprofen (ADVIL,MOTRIN) 600 MG tablet Take 1 tablet (600 mg total) by mouth every 6 (six) hours as needed. 04/22/18   Enid DerryWagner,  Rozlynn Lippold, PA-C  lidocaine (LIDODERM) 5 % Place 1 patch onto the skin daily. Remove & Discard patch within 12 hours or as directed by MD 04/22/18   Enid DerryWagner, Gardiner Espan[place], PA-C  oxyCODONE-acetaminophen (PERCOCET/ROXICET) 5-325 MG tablet Take 1 tablet by mouth every 4 (four) hours as needed for severe pain. 12/02/15   Darci CurrentBrown, [place] N, MD  predniSONE (DELTASONE) 50 MG tablet Take 1 tablet (50 mg total) by mouth daily with breakfast. 02/22/17   Cuthriell, Delorise RoyalsJonathan D, PA-C  promethazine-codeine (PHENERGAN WITH CODEINE) 6.25-10 MG/5ML syrup Take 7.5 mLs by mouth every 6 (six) hours as needed for cough. 04/26/15   Triplett, Rulon Eisenmengerari B, FNP    Allergies Coconut oil and Tramadol  No family history on file.  Social History Social History   Tobacco Use  . Smoking status: Current Every Day Smoker    Packs/day: 0.50    Years: 16.00    Pack years: 8.00    Types: Cigarettes  . Smokeless tobacco: Never Used  Substance Use Topics  . Alcohol use: Yes    Comment: occasionally  . Drug use: No     Review of Systems  Cardiovascular: No chest pain. Respiratory:  No SOB. Gastrointestinal: No abdominal pain.  No nausea, no vomiting.  Skin: Negative for rash, abrasions, lacerations, ecchymosis. Neurological: Negative for numbness or tingling   ____________________________________________   PHYSICAL EXAM:  VITAL SIGNS: ED Triage Vitals  Enc Vitals Group     BP 04/22/18 1242 (!) 151/87     Pulse Rate 04/22/18 1242 88     Resp 04/22/18 1242  20     Temp 04/22/18 1242 98.1 F (36.7 C)     Temp Source 04/22/18 1242 Oral     SpO2 04/22/18 1242 99 %     Weight 04/22/18 1237 200 lb (90.7 kg)     Height 04/22/18 1237 6\' 3"  (1.905 m)     Head Circumference --      Peak Flow --      Pain Score 04/22/18 1237 10     Pain Loc --      Pain Edu? --      Excl. in GC? --      Constitutional: Alert and oriented. Well appearing and in no acute distress. Eyes: Conjunctivae are normal. PERRL. EOMI. Head:  Atraumatic. ENT:      Ears:      Nose: No congestion/rhinnorhea.      Mouth/Throat: Mucous membranes are moist.  Neck: No stridor. No cervical spine tenderness to palpation. Cardiovascular: Normal rate, regular rhythm.  Good peripheral circulation. Respiratory: Normal respiratory effort without tachypnea or retractions. Lungs CTAB. Good air entry to the bases with no decreased or absent breath sounds. Musculoskeletal: Full range of motion to all extremities. No gross deformities appreciated. Pain with abduction of shoulder. Tenderness to palpation between right neck and right shoulder. Strength equal in upper extremities bilaterally. Neurologic:  Normal speech and language. No gross focal neurologic deficits are appreciated.  Skin:  Skin is warm, dry and intact. No rash noted. Psychiatric: Mood and affect are normal. Speech and behavior are normal. Patient exhibits appropriate insight and judgement.   ____________________________________________   LABS (all labs ordered are listed, but only abnormal results are displayed)  Labs Reviewed - No data to display ____________________________________________  EKG   ____________________________________________  RADIOLOGY Lexine Baton, personally viewed and evaluated these images (plain radiographs) as part of my medical decision making, as well as reviewing the written report by the radiologist.  Dg Shoulder Right  Result Date: 04/22/2018 CLINICAL DATA:  Sharp pain in shoulder beginning last night. Painful to left. EXAM: RIGHT SHOULDER - 2+ VIEW COMPARISON:  None. FINDINGS: There is no evidence of fracture or dislocation. There is no evidence of arthropathy or other focal bone abnormality. Soft tissues are unremarkable. IMPRESSION: Negative. Electronically Signed   By: Marin Roberts M.Mcclain.   On: 04/22/2018 13:19    ____________________________________________    PROCEDURES  Procedure(s) performed:     Procedures    Medications  orphenadrine (NORFLEX) injection 60 mg (60 mg Intramuscular Given 04/22/18 1523)  lidocaine (LIDODERM) 5 % 1 patch (1 patch Transdermal Patch Applied 04/22/18 1523)  ketorolac (TORADOL) 30 MG/ML injection 30 mg (30 mg Intramuscular Given 04/22/18 1523)     ____________________________________________   INITIAL IMPRESSION / ASSESSMENT AND PLAN / ED COURSE  Pertinent labs & imaging results that were available during my care of the patient were reviewed by me and considered in my medical decision making (see chart for details).  Review of the [place] CSRS was performed in accordance of the [facility] prior to dispensing any controlled drugs.     Patient's diagnosis is consistent with muscle strain. Patient will be discharged home with prescriptions for flexeril, ibuprofen, lidoderm. Patient is to follow up with PCP as directed. Patient is given ED precautions to return to the ED for any worsening or new symptoms.     ____________________________________________  FINAL CLINICAL IMPRESSION(S) / ED DIAGNOSES  Final diagnoses:  Strain of right trapezius muscle, initial encounter      NEW MEDICATIONS  STARTED DURING THIS VISIT:  ED Discharge Orders         Ordered    cyclobenzaprine (FLEXERIL) 5 MG tablet     04/22/18 1431    ibuprofen (ADVIL,MOTRIN) 600 MG tablet  Every 6 hours PRN     04/22/18 1431    lidocaine (LIDODERM) 5 %  Every 24 hours     04/22/18 1431              This chart was dictated using voice recognition software/Dragon. Despite best efforts to proofread, errors can occur which can change the meaning. Any change was purely unintentional.    Enid Derry, PA-C 04/22/18 1740    Dionne Bucy, MD 04/30/18 [id number]

## 2018-07-29 ENCOUNTER — Emergency Department
Admission: EM | Admit: 2018-07-29 | Discharge: 2018-07-29 | Disposition: A | Discharge status: Home / Self Care | Payer: BLUE CROSS/BLUE SHIELD | Attending: Emergency Medicine | Admitting: Emergency Medicine

## 2018-07-29 ENCOUNTER — Emergency Department: Payer: BLUE CROSS/BLUE SHIELD

## 2018-07-29 ENCOUNTER — Encounter: Payer: Self-pay | Admitting: Emergency Medicine

## 2018-07-29 ENCOUNTER — Other Ambulatory Visit: Payer: Self-pay

## 2018-07-29 DIAGNOSIS — M5412 Radiculopathy, cervical region: Secondary | ICD-10-CM | POA: Insufficient documentation

## 2018-07-29 DIAGNOSIS — F17210 Nicotine dependence, cigarettes, uncomplicated: Secondary | ICD-10-CM | POA: Insufficient documentation

## 2018-07-29 DIAGNOSIS — Z79899 Other long term (current) drug therapy: Secondary | ICD-10-CM | POA: Insufficient documentation

## 2018-07-29 MED ORDER — DEXAMETHASONE SODIUM PHOSPHATE 10 MG/ML IJ SOLN
10.0000 mg | Freq: Once | INTRAMUSCULAR | Status: AC
  Administered 2018-07-29: 10 mg via INTRAMUSCULAR
  Filled 2018-07-29: qty 1

## 2018-07-29 MED ORDER — PREDNISONE 10 MG (21) PO TBPK: ORAL_TABLET | ORAL | 0 refills | Status: DC

## 2018-07-29 MED ORDER — KETOROLAC TROMETHAMINE 30 MG/ML IJ SOLN
30.0000 mg | Freq: Once | INTRAMUSCULAR | Status: AC
  Administered 2018-07-29: 30 mg via INTRAMUSCULAR
  Filled 2018-07-29: qty 1

## 2018-07-29 NOTE — ED Provider Notes (Signed)
[facility] Emergency Department Provider Note  ____________________________________________  Time seen: Approximately 10:27 PM  I have reviewed the triage vital signs and the nursing notes.   HISTORY  Chief Complaint Shoulder Pain    HPI Antonio Mcclain is a 38 y.o. male presents to the emergency department with right upper extremity pain that feels like a burning sharp sensation.  Patient reports that pain radiates from his right shoulder into his right hand and is worsened with range of motion at the neck.  No new falls or traumas.  Patient denies weakness of the upper extremities.  Patient states that he has had pain for the past 2 weeks that has not improved with muscle relaxers at home.  Patient has not experienced similar symptoms in the past.  No other alleviating measures have been attempted.    History reviewed. No pertinent past medical history.  There are no active problems to display for this patient.   History reviewed. No pertinent surgical history.  Prior to Admission medications   Medication Sig Start Date End Date Taking? Authorizing Provider  methocarbamol (ROBAXIN) 750 MG tablet Take 750 mg by mouth 4 (four) times daily.   Yes [provider]  albuterol (PROVENTIL HFA;VENTOLIN HFA) 108 (90 Base) MCG/ACT inhaler Inhale 2 puffs into the lungs every 4 (four) hours as needed for wheezing or shortness of breath. 02/22/17   Cuthriell, Delorise Royals, PA-C  amoxicillin-clavulanate (AUGMENTIN) 875-125 MG tablet Take 1 tablet by mouth 2 (two) times daily. 02/22/17   Cuthriell, Delorise Royals, PA-C  brompheniramine-pseudoephedrine-DM 30-2-10 MG/5ML syrup Take 10 mLs by mouth 4 (four) times daily as needed. 02/22/17   Cuthriell, Delorise Royals, PA-C  cyclobenzaprine (FLEXERIL) 5 MG tablet Take 1-2 tablets 3 times daily as needed 04/22/18   Enid Derry, PA-C  fluticasone [facility]) 50 MCG/ACT nasal spray Place 1 spray into both nostrils 2 (two) times  daily. 02/22/17   Cuthriell, Delorise Royals, PA-C  ibuprofen (ADVIL,MOTRIN) 600 MG tablet Take 1 tablet (600 mg total) by mouth every 6 (six) hours as needed. 04/22/18   Enid Derry, PA-C  lidocaine (LIDODERM) 5 % Place 1 patch onto the skin daily. Remove & Discard patch within 12 hours or as directed by MD 04/22/18   Enid Derry, PA-C  oxyCODONE-acetaminophen (PERCOCET/ROXICET) 5-325 MG tablet Take 1 tablet by mouth every 4 (four) hours as needed for severe pain. 12/02/15   Darci Current, MD  predniSONE (STERAPRED UNI-PAK 21 TAB) 10 MG (21) TBPK tablet Take 6 tabs the the 1st day. Take 6 tabs the the 2nd day. Take 5 tabs the the 3rd day. Take 5 tabs the 4th day. Take 4 tabs the the 5th day.Take 4 tabs the the 6th day.Take 3 tabs the 7th day.Take 3 tabs the 8th day. Take 2 tabs the 9th day. Take 2 tabs the 10th day. Take 1 tab the 11th day. Take 1 tab the 12th day. 07/29/18   Orvil Feil, PA-C  promethazine-codeine (PHENERGAN WITH CODEINE) 6.25-10 MG/5ML syrup Take 7.5 mLs by mouth every 6 (six) hours as needed for cough. 04/26/15   Triplett, Rulon Eisenmenger B, FNP    Allergies Coconut oil and Tramadol  No family history on file.  Social History Social History   Tobacco Use  . Smoking status: Current Every Day Smoker    Packs/day: 0.50    Years: 16.00    Pack years: 8.00    Types: Cigarettes  . Smokeless tobacco: Never Used  Substance Use Topics  .  Alcohol use: Yes    Comment: occasionally  . Drug use: No     Review of Systems  Constitutional: No fever/chills Eyes: No visual changes. No discharge ENT: No upper respiratory complaints. Cardiovascular: no chest pain. Respiratory: no cough. No SOB. Gastrointestinal: No abdominal pain.  No nausea, no vomiting.  No diarrhea.  No constipation.  Musculoskeletal: Patient has right upper extremity pain.  Skin: Negative for rash, abrasions, lacerations, ecchymosis. Neurological: Negative for headaches, focal weakness or  numbness.   ____________________________________________   PHYSICAL EXAM:  VITAL SIGNS: ED Triage Vitals  Enc Vitals Group     BP 07/29/18 2030 (!) 164/86     Pulse Rate 07/29/18 2030 80     Resp 07/29/18 2030 18     Temp 07/29/18 2030 98.7 F (37.1 C)     Temp Source 07/29/18 2030 Oral     SpO2 07/29/18 2030 100 %     Weight 07/29/18 2031 205 lb (93 kg)     Height 07/29/18 2031 6\' 3"  (1.905 m)     Head Circumference --      Peak Flow --      Pain Score 07/29/18 2034 10     Pain Loc --      Pain Edu? --      Excl. in GC? --      Constitutional: Alert and oriented. Well appearing and in no acute distress. Eyes: Conjunctivae are normal. PERRL. EOMI. Head: Atraumatic. Cardiovascular: Normal rate, regular rhythm. Normal S1 and S2.  Good peripheral circulation. Respiratory: Normal respiratory effort without tachypnea or retractions. Lungs CTAB. Good air entry to the bases with no decreased or absent breath sounds. Musculoskeletal: Full range of motion to all extremities. No gross deformities appreciated.  Patient has 5 out of 5 strength in the upper extremities.  Patient's symptoms are reproduced with range of motion at the neck.  Patient has paraspinal muscle tenderness along the cervical spine.  Palpable radial pulse, right. Neurologic:  Normal speech and language. No gross focal neurologic deficits are appreciated.  Skin:  Skin is warm, dry and intact. No rash noted. Psychiatric: Mood and affect are normal. Speech and behavior are normal. Patient exhibits appropriate insight and judgement.   ____________________________________________   LABS (all labs ordered are listed, but only abnormal results are displayed)  Labs Reviewed - No data to display ____________________________________________  EKG   ____________________________________________  RADIOLOGY I personally viewed and evaluated these images as part of my medical decision making, as well as reviewing the  written report by the radiologist.    Dg Cervical Spine 2-3 Views  Result Date: 07/29/2018 CLINICAL DATA:  Cervical radiculopathy EXAM: CERVICAL SPINE - 2-3 VIEW COMPARISON:  None. FINDINGS: There is no evidence of cervical spine fracture or prevertebral soft tissue swelling. Alignment is normal. No other significant bone abnormalities are identified. IMPRESSION: Negative cervical spine radiographs. Electronically Signed   By: Charlett Nose M.D.   On: 07/29/2018 22:49    ____________________________________________    PROCEDURES  Procedure(s) performed:    Procedures    Medications  dexamethasone (DECADRON) injection 10 mg (10 mg Intramuscular Given 07/29/18 2239)  ketorolac (TORADOL) 30 MG/ML injection 30 mg (30 mg Intramuscular Given 07/29/18 2239)     ____________________________________________   INITIAL IMPRESSION / ASSESSMENT AND PLAN / ED COURSE  Pertinent labs & imaging results that were available during my care of the patient were reviewed by me and considered in my medical decision making (see chart for details).  Review  of the [place] CSRS was performed in accordance of the [facility] prior to dispensing any controlled drugs.      Assessment and Plan: Cervical Radiculopathy:  Patient presents to the emergency department with cervical radiculopathy.  No deficits in strength were appreciated on physical exam.  Patient reported that his pain improved significantly after Toradol and Decadron were administered.  X-ray examination of the cervical spine revealed no acute abnormality.  Patient was discharged with tapered prednisone.  He was advised to follow-up with neurosurgery as needed.  All patient questions were answered.    ____________________________________________  FINAL CLINICAL IMPRESSION(S) / ED DIAGNOSES  Final diagnoses:  Cervical radiculopathy      NEW MEDICATIONS STARTED DURING THIS VISIT:  ED Discharge Orders         Ordered    predniSONE (STERAPRED  UNI-PAK 21 TAB) 10 MG (21) TBPK tablet     07/29/18 2301              This chart was dictated using voice recognition software/Dragon. Despite best efforts to proofread, errors can occur which can change the meaning. Any change was purely unintentional.    Orvil Feil, PA-C 07/29/18 Janetta Hora    Minna Antis, MD 07/30/18 [phone]

## 2018-07-29 NOTE — ED Triage Notes (Addendum)
Patient ambulatory to triage with steady gait, without difficulty or distress noted; st in November injured right shoulder; since Valentine's began having return of pain "from trapezoid down my arm", worse with ROM; rx muscle relaxer without relief

## 2018-08-01 ENCOUNTER — Other Ambulatory Visit: Payer: Self-pay | Admitting: Orthopedic Surgery

## 2018-08-01 ENCOUNTER — Other Ambulatory Visit (HOSPITAL_COMMUNITY): Payer: Self-pay | Admitting: Orthopedic Surgery

## 2018-08-01 DIAGNOSIS — M5412 Radiculopathy, cervical region: Secondary | ICD-10-CM

## 2018-08-02 ENCOUNTER — Ambulatory Visit
Admission: RE | Admit: 2018-08-02 | Discharge: 2018-08-02 | Disposition: A | Discharge status: Home / Self Care | Payer: BLUE CROSS/BLUE SHIELD | Source: Ambulatory Visit | Attending: Orthopedic Surgery | Admitting: Orthopedic Surgery

## 2018-08-02 DIAGNOSIS — M5412 Radiculopathy, cervical region: Secondary | ICD-10-CM

## 2020-04-24 ENCOUNTER — Encounter: Payer: Self-pay | Admitting: Emergency Medicine

## 2020-04-24 ENCOUNTER — Other Ambulatory Visit: Payer: Self-pay

## 2020-04-24 ENCOUNTER — Emergency Department: Payer: Self-pay

## 2020-04-24 ENCOUNTER — Emergency Department
Admission: EM | Admit: 2020-04-24 | Discharge: 2020-04-24 | Disposition: A | Discharge status: Home / Self Care | Payer: Self-pay | Attending: Emergency Medicine | Admitting: Emergency Medicine

## 2020-04-24 DIAGNOSIS — J069 Acute upper respiratory infection, unspecified: Secondary | ICD-10-CM | POA: Insufficient documentation

## 2020-04-24 DIAGNOSIS — Z20822 Contact with and (suspected) exposure to covid-19: Secondary | ICD-10-CM | POA: Insufficient documentation

## 2020-04-24 DIAGNOSIS — F17210 Nicotine dependence, cigarettes, uncomplicated: Secondary | ICD-10-CM | POA: Insufficient documentation

## 2020-04-24 LAB — RESP PANEL BY RT-PCR (FLU A&B, COVID) ARPGX2
Influenza A by PCR: NEGATIVE
Influenza B by PCR: NEGATIVE
SARS Coronavirus 2 by RT PCR: NEGATIVE

## 2020-04-24 MED ORDER — PSEUDOEPH-BROMPHEN-DM 30-2-10 MG/5ML PO SYRP: 5.0000 mL | ORAL_SOLUTION | Freq: Four times a day (QID) | ORAL | 0 refills | Status: DC | PRN

## 2020-04-24 MED ORDER — IBUPROFEN 600 MG PO TABS: 600.0000 mg | ORAL_TABLET | Freq: Three times a day (TID) | ORAL | 0 refills | Status: DC | PRN

## 2020-04-24 NOTE — ED Triage Notes (Signed)
Pt arrived via POV with reports of chest congestion and cough since yesterday.  No distress noted on arrival, pt denies fevers.  Pt reports cough is productive at times with brown sputum.

## 2020-04-24 NOTE — Discharge Instructions (Signed)
Your test was negative for COVID-19 or influenza.  His chest x-ray was unremarkable.  Follow discharge care instruction take medication as directed.

## 2020-04-24 NOTE — ED Provider Notes (Signed)
[facility] Emergency Department Provider Note   ____________________________________________   First MD Initiated Contact with Patient 04/24/20 1300     (approximate)  I have reviewed the triage vital signs and the nursing notes.   HISTORY  Chief Complaint Cough and Chest Congestion    HPI Antonio Mcclain is a 39 y.o. male patient presents with chest congestion and cough which started yesterday.  Patient states cough is productive and greenish in color.  Patient denies recent travel or known contact with COVID-19.  Patient has been immunized with the COVID-19 vaccine.  Patient denies pain or fever.  Patient denies nausea, vomiting, diarrhea.         History reviewed. No pertinent past medical history.  There are no problems to display for this patient.   History reviewed. No pertinent surgical history.  Prior to Admission medications   Medication Sig Start Date End Date Taking? Authorizing Provider  albuterol (PROVENTIL HFA;VENTOLIN HFA) 108 (90 Base) MCG/ACT inhaler Inhale 2 puffs into the lungs every 4 (four) hours as needed for wheezing or shortness of breath. 02/22/17   Cuthriell, Delorise Royals, PA-C  amoxicillin-clavulanate (AUGMENTIN) 875-125 MG tablet Take 1 tablet by mouth 2 (two) times daily. 02/22/17   Cuthriell, Delorise Royals, PA-C  brompheniramine-pseudoephedrine-DM 30-2-10 MG/5ML syrup Take 10 mLs by mouth 4 (four) times daily as needed. 02/22/17   Cuthriell, Delorise Royals, PA-C  brompheniramine-pseudoephedrine-DM 30-2-10 MG/5ML syrup Take 5 mLs by mouth 4 (four) times daily as needed. 04/24/20   Joni Reining, PA-C  cyclobenzaprine (FLEXERIL) 5 MG tablet Take 1-2 tablets 3 times daily as needed 04/22/18   Enid Derry, PA-C  fluticasone [facility]) 50 MCG/ACT nasal spray Place 1 spray into both nostrils 2 (two) times daily. 02/22/17   Cuthriell, Delorise Royals, PA-C  ibuprofen (ADVIL) 600 MG tablet Take 1 tablet (600 mg total) by mouth every 8  (eight) hours as needed. 04/24/20   Joni Reining, PA-C  ibuprofen (ADVIL,MOTRIN) 600 MG tablet Take 1 tablet (600 mg total) by mouth every 6 (six) hours as needed. 04/22/18   Enid Derry, PA-C  lidocaine (LIDODERM) 5 % Place 1 patch onto the skin daily. Remove & Discard patch within 12 hours or as directed by MD 04/22/18   Enid Derry, PA-C  methocarbamol (ROBAXIN) 750 MG tablet Take 750 mg by mouth 4 (four) times daily.    [provider]  oxyCODONE-acetaminophen (PERCOCET/ROXICET) 5-325 MG tablet Take 1 tablet by mouth every 4 (four) hours as needed for severe pain. 12/02/15   Darci Current, MD  predniSONE (STERAPRED UNI-PAK 21 TAB) 10 MG (21) TBPK tablet Take 6 tabs the the 1st day. Take 6 tabs the the 2nd day. Take 5 tabs the the 3rd day. Take 5 tabs the 4th day. Take 4 tabs the the 5th day.Take 4 tabs the the 6th day.Take 3 tabs the 7th day.Take 3 tabs the 8th day. Take 2 tabs the 9th day. Take 2 tabs the 10th day. Take 1 tab the 11th day. Take 1 tab the 12th day. 07/29/18   Orvil Feil, PA-C  promethazine-codeine (PHENERGAN WITH CODEINE) 6.25-10 MG/5ML syrup Take 7.5 mLs by mouth every 6 (six) hours as needed for cough. 04/26/15   Triplett, Rulon Eisenmenger B, FNP    Allergies Bee pollen, Coconut oil, and Tramadol  No family history on file.  Social History Social History   Tobacco Use   Smoking status: Current Every Day Smoker    Packs/day: 0.50  Years: 16.00    Pack years: 8.00    Types: Cigarettes   Smokeless tobacco: Never Used  Vaping Use   Vaping Use: Never used  Substance Use Topics   Alcohol use: Yes    Comment: occasionally   Drug use: No    Review of Systems Constitutional: No fever/chills Eyes: No visual changes. ENT: No sore throat. Cardiovascular: Denies chest pain. Respiratory: Denies shortness of breath. Gastrointestinal: No abdominal pain.  No nausea, no vomiting.  No diarrhea.  No constipation. Genitourinary: Negative for  dysuria. Musculoskeletal: Negative for back pain. Skin: Negative for rash. Neurological: Negative for headaches, focal weakness or numbness. {**Psychiatric:  Endocrine:  Hematological/Lymphatic:  Allergic/Immunilogical: Bee pollen, coconut oil, and tramadol. ____________________________________________   PHYSICAL EXAM:  VITAL SIGNS: ED Triage Vitals  Enc Vitals Group     BP 04/24/20 1221 (!) 150/87     Pulse Rate 04/24/20 1221 65     Resp 04/24/20 1221 18     Temp 04/24/20 1221 98.4 F (36.9 C)     Temp Source 04/24/20 1221 Oral     SpO2 04/24/20 1221 99 %     Weight 04/24/20 1222 205 lb (93 kg)     Height 04/24/20 1222 6\' 3"  (1.905 m)     Head Circumference --      Peak Flow --      Pain Score 04/24/20 1221 0     Pain Loc --      Pain Edu? --      Excl. in GC? --    Constitutional: Alert and oriented. Well appearing and in no acute distress. Eyes: Conjunctivae are normal. PERRL. EOMI. Head: Atraumatic. Nose: No congestion/rhinnorhea. Mouth/Throat: Mucous membranes are moist.  Oropharynx non-erythematous. Neck: No stridor.  Hematological/Lymphatic/Immunilogical: No cervical lymphadenopathy. Cardiovascular: Normal rate, regular rhythm. Grossly normal heart sounds.  Good peripheral circulation.  Elevated blood pressure. Respiratory: Normal respiratory effort.  No retractions. Lungs CTAB. Gastrointestinal: Soft and nontender. No distention. No abdominal bruits. No CVA tenderness. Skin:  Skin is warm, dry and intact. No rash noted. Psychiatric: Mood and affect are normal. Speech and behavior are normal.  ____________________________________________   LABS (all labs ordered are listed, but only abnormal results are displayed)  Labs Reviewed  RESP PANEL BY RT-PCR (FLU A&B, COVID) ARPGX2   ____________________________________________  EKG   ____________________________________________  RADIOLOGY I, 04/26/20, personally viewed and evaluated these images  (plain radiographs) as part of my medical decision making, as well as reviewing the written report by the radiologist.  ED MD interpretation: Acute findings on chest x-ray.  Official radiology report(s): DG Chest Portable 1 View  Result Date: 04/24/2020 CLINICAL DATA:  Productive cough and body aches. EXAM: PORTABLE CHEST 1 VIEW COMPARISON:  None. FINDINGS: Both lungs are clear. Heart and mediastinum are within normal limits. Trachea is midline. Negative for a pneumothorax. Bone structures are unremarkable. IMPRESSION: No active disease. Electronically Signed   By: 04/26/2020 M.D.   On: 04/24/2020 14:03    ____________________________________________   PROCEDURES  Procedure(s) performed (including Critical Care):  Procedures   ____________________________________________   INITIAL IMPRESSION / ASSESSMENT AND PLAN / ED COURSE  As part of my medical decision making, I reviewed the following data within the electronic MEDICAL RECORD NUMBER         Patient presents for onset of cough and congestion which started yesterday.  Discussed no acute findings on chest x-ray.  Patient COVID-19 and flu results were negative.  Patient complaining physical exam  consistent with viral respiratory infection with cough.  Patient given discharge care instruction advised take medication as directed.  Patient advised follow-up PCP.      ____________________________________________   FINAL CLINICAL IMPRESSION(S) / ED DIAGNOSES  Final diagnoses:  Viral URI with cough     ED Discharge Orders         Ordered    brompheniramine-pseudoephedrine-DM 30-2-10 MG/5ML syrup  4 times daily PRN        04/24/20 1504    ibuprofen (ADVIL) 600 MG tablet  Every 8 hours PRN        04/24/20 1504          *Please note:  Antonio Mcclain was evaluated in Emergency Department on 04/24/2020 for the symptoms described in the history of present illness. He was evaluated in the context of the global COVID-19  pandemic, which necessitated consideration that the patient might be at risk for infection with the SARS-CoV-2 virus that causes COVID-19. Institutional protocols and algorithms that pertain to the evaluation of patients at risk for COVID-19 are in a state of rapid change based on information released by regulatory bodies including the CDC and federal and state organizations. These policies and algorithms were followed during the patient's care in the ED.  Some ED evaluations and interventions may be delayed as a result of limited staffing during and the pandemic.*   Note:  This document was prepared using Dragon voice recognition software and may include unintentional dictation errors.    Joni Reining, PA-C 04/24/20 1508    Dionne Bucy, MD 04/24/20 1525

## 2020-08-26 ENCOUNTER — Emergency Department
Admission: EM | Admit: 2020-08-26 | Discharge: 2020-08-26 | Disposition: A | Discharge status: Home / Self Care | Payer: Self-pay | Attending: Emergency Medicine | Admitting: Emergency Medicine

## 2020-08-26 ENCOUNTER — Other Ambulatory Visit: Payer: Self-pay

## 2020-08-26 ENCOUNTER — Emergency Department: Payer: Self-pay

## 2020-08-26 DIAGNOSIS — R410 Disorientation, unspecified: Secondary | ICD-10-CM | POA: Insufficient documentation

## 2020-08-26 DIAGNOSIS — I10 Essential (primary) hypertension: Secondary | ICD-10-CM | POA: Insufficient documentation

## 2020-08-26 DIAGNOSIS — R519 Headache, unspecified: Secondary | ICD-10-CM | POA: Insufficient documentation

## 2020-08-26 DIAGNOSIS — R42 Dizziness and giddiness: Secondary | ICD-10-CM | POA: Insufficient documentation

## 2020-08-26 DIAGNOSIS — H53149 Visual discomfort, unspecified: Secondary | ICD-10-CM | POA: Insufficient documentation

## 2020-08-26 DIAGNOSIS — F17210 Nicotine dependence, cigarettes, uncomplicated: Secondary | ICD-10-CM | POA: Insufficient documentation

## 2020-08-26 HISTORY — DX: Essential (primary) hypertension: I10

## 2020-08-26 MED ORDER — DIPHENHYDRAMINE HCL 25 MG PO CAPS
50.0000 mg | ORAL_CAPSULE | Freq: Once | ORAL | Status: AC
  Administered 2020-08-26: 50 mg via ORAL
  Filled 2020-08-26: qty 2

## 2020-08-26 MED ORDER — KETOROLAC TROMETHAMINE 60 MG/2ML IM SOLN
60.0000 mg | Freq: Once | INTRAMUSCULAR | Status: AC
  Administered 2020-08-26: 60 mg via INTRAMUSCULAR
  Filled 2020-08-26: qty 2

## 2020-08-26 MED ORDER — CLONIDINE HCL 0.1 MG PO TABS
0.1000 mg | ORAL_TABLET | Freq: Once | ORAL | Status: AC
  Administered 2020-08-26: 0.1 mg via ORAL
  Filled 2020-08-26: qty 1

## 2020-08-26 NOTE — ED Provider Notes (Signed)
[facility] Emergency Department Provider Note   ____________________________________________   Event Date/Time   First MD Initiated Contact with Patient 08/26/20 1313     (approximate)  I have reviewed the triage vital signs and the nursing notes.   HISTORY  Chief Complaint Migraine    HPI Antonio Mcclain is a 40 y.o. male patient state he woke up with a "pounding" headache.  Patient stated felt dizzy and confused.  Patient stated when he leaned forward there was frontal pressure.  Patient stated history of hypertension 3 to 4 years ago.  Patient state he was followed by Dr. And was told his blood pressure returned to normal and no medication was given.  Patient stated BP today at home was 200/110.  Patient his pulse was 97.  Patient presents for blood pressure 158/108 and severe headache.  Patient also complain of photophobia.  Patient denies dizziness or weakness.         Past Medical History:  Diagnosis Date  . Hypertension     There are no problems to display for this patient.   History reviewed. No pertinent surgical history.  Prior to Admission medications   Medication Sig Start Date End Date Taking? Authorizing Provider  albuterol (PROVENTIL HFA;VENTOLIN HFA) 108 (90 Base) MCG/ACT inhaler Inhale 2 puffs into the lungs every 4 (four) hours as needed for wheezing or shortness of breath. 02/22/17   Cuthriell, Delorise Royals, PA-C  amoxicillin-clavulanate (AUGMENTIN) 875-125 MG tablet Take 1 tablet by mouth 2 (two) times daily. 02/22/17   Cuthriell, Delorise Royals, PA-C  brompheniramine-pseudoephedrine-DM 30-2-10 MG/5ML syrup Take 10 mLs by mouth 4 (four) times daily as needed. 02/22/17   Cuthriell, Delorise Royals, PA-C  brompheniramine-pseudoephedrine-DM 30-2-10 MG/5ML syrup Take 5 mLs by mouth 4 (four) times daily as needed. 04/24/20   Joni Reining, PA-C  cyclobenzaprine (FLEXERIL) 5 MG tablet Take 1-2 tablets 3 times daily as needed 04/22/18   Enid Derry, PA-C  fluticasone [facility]) 50 MCG/ACT nasal spray Place 1 spray into both nostrils 2 (two) times daily. 02/22/17   Cuthriell, Delorise Royals, PA-C  ibuprofen (ADVIL) 600 MG tablet Take 1 tablet (600 mg total) by mouth every 8 (eight) hours as needed. 04/24/20   Joni Reining, PA-C  ibuprofen (ADVIL,MOTRIN) 600 MG tablet Take 1 tablet (600 mg total) by mouth every 6 (six) hours as needed. 04/22/18   Enid Derry, PA-C  lidocaine (LIDODERM) 5 % Place 1 patch onto the skin daily. Remove & Discard patch within 12 hours or as directed by MD 04/22/18   Enid Derry, PA-C  methocarbamol (ROBAXIN) 750 MG tablet Take 750 mg by mouth 4 (four) times daily.    [provider]  oxyCODONE-acetaminophen (PERCOCET/ROXICET) 5-325 MG tablet Take 1 tablet by mouth every 4 (four) hours as needed for severe pain. 12/02/15   Darci Current, MD  predniSONE (STERAPRED UNI-PAK 21 TAB) 10 MG (21) TBPK tablet Take 6 tabs the the 1st day. Take 6 tabs the the 2nd day. Take 5 tabs the the 3rd day. Take 5 tabs the 4th day. Take 4 tabs the the 5th day.Take 4 tabs the the 6th day.Take 3 tabs the 7th day.Take 3 tabs the 8th day. Take 2 tabs the 9th day. Take 2 tabs the 10th day. Take 1 tab the 11th day. Take 1 tab the 12th day. 07/29/18   Orvil Feil, PA-C  promethazine-codeine (PHENERGAN WITH CODEINE) 6.25-10 MG/5ML syrup Take 7.5 mLs by mouth every 6 (six)  hours as needed for cough. 04/26/15   Triplett, Rulon Eisenmenger B, FNP    Allergies Bee pollen, Coconut oil, and Tramadol  History reviewed. No pertinent family history.  Social History Social History   Tobacco Use  . Smoking status: Current Every Day Smoker    Packs/day: 0.50    Years: 16.00    Pack years: 8.00    Types: Cigarettes  . Smokeless tobacco: Never Used  Vaping Use  . Vaping Use: Never used  Substance Use Topics  . Alcohol use: Yes    Comment: occasionally  . Drug use: No    Review of Systems Constitutional: No fever/chills Eyes: No  visual changes. ENT: No sore throat. Cardiovascular: Denies chest pain. Respiratory: Denies shortness of breath. Gastrointestinal: No abdominal pain.  No nausea, no vomiting.  No diarrhea.  No constipation. Genitourinary: Negative for dysuria. Musculoskeletal: Negative for back pain. Skin: Negative for rash. Neurological: Negative for headaches, focal weakness or numbness. Hematological/Lymphatic:  Allergic/Immunilogical: Bee pollen, coconut oil, and tramadol. ____________________________________________   PHYSICAL EXAM:  VITAL SIGNS: ED Triage Vitals  Enc Vitals Group     BP 08/26/20 1227 (!) 158/108     Pulse Rate 08/26/20 1227 68     Resp 08/26/20 1227 17     Temp 08/26/20 1227 98.3 F (36.8 C)     Temp Source 08/26/20 1227 Oral     SpO2 08/26/20 1227 99 %     Weight 08/26/20 1227 205 lb (93 kg)     Height 08/26/20 1227 6\' 3"  (1.905 m)     Head Circumference --      Peak Flow --      Pain Score 08/26/20 1249 10     Pain Loc --      Pain Edu? --      Excl. in GC? --    Constitutional: Alert and oriented. Well appearing and in no acute distress. Eyes: Conjunctivae are normal. PERRL. EOMI. Head: Atraumatic. Nose: No congestion/rhinnorhea. Mouth/Throat: Mucous membranes are moist.  Oropharynx non-erythematous. Neck: No stridor. Hematological/Lymphatic/Immunilogical: No cervical lymphadenopathy. Cardiovascular: Normal rate, regular rhythm. Grossly normal heart sounds.  Good peripheral circulation. Respiratory: Normal respiratory effort.  No retractions. Lungs CTAB. Gastrointestinal: Soft and nontender. No distention. No abdominal bruits. No CVA tenderness. Musculoskeletal: No lower extremity tenderness nor edema.  No joint effusions. Neurologic:  Normal speech and language. No gross focal neurologic deficits are appreciated. No gait instability. Skin:  Skin is warm, dry and intact. No rash noted. Psychiatric: Mood and affect are normal. Speech and behavior are  normal.  ____________________________________________   LABS (all labs ordered are listed, but only abnormal results are displayed)  Labs Reviewed - No data to display ____________________________________________  EKG   ____________________________________________  RADIOLOGY I, 08/28/20, personally viewed and evaluated these images (plain radiographs) as part of my medical decision making, as well as reviewing the written report by the radiologist.  ED MD interpretation:   Official radiology report(s): CT Head Wo Contrast  Result Date: 08/26/2020 CLINICAL DATA:  Headache, intracranial hemorrhage suspected. Additional provided: Patient reports pounding migraine, dizziness, confusion, history of hypertension. EXAM: CT HEAD WITHOUT CONTRAST TECHNIQUE: Contiguous axial images were obtained from the base of the skull through the vertex without intravenous contrast. COMPARISON:  No pertinent prior exams available for comparison. FINDINGS: Brain: Cerebral volume is normal for age. There is no acute intracranial hemorrhage. No demarcated cortical infarct. No extra-axial fluid collection. No evidence of intracranial mass. No midline shift. Vascular: No hyperdense vessel.  Skull: Normal. Negative for fracture or focal lesion. Sinuses/Orbits: Visualized orbits show no acute finding. Mild bilateral ethmoid sinus mucosal thickening. IMPRESSION: No evidence of acute intracranial abnormality. Mild bilateral ethmoid sinus mucosal thickening. Electronically Signed   By: Jackey Loge DO   On: 08/26/2020 14:06    ____________________________________________   PROCEDURES  Procedure(s) performed (including Critical Care):  Procedures   ____________________________________________   INITIAL IMPRESSION / ASSESSMENT AND PLAN / ED COURSE  As part of my medical decision making, I reviewed the following data within the electronic MEDICAL RECORD NUMBER         Patient presents with headache and  elevated blood pressure.  Patient was concerned for CVA.  Discussed no acute findings on CT of the head.  Patient had responded well to Toradol, Benadryl, and Catapres.  Patient complaining physical exam consistent with hypertension headache.  Patient given discharge care instruction advised to follow-up with PCP for 3-day blood pressure check for further evaluation of hypertension.      ____________________________________________   FINAL CLINICAL IMPRESSION(S) / ED DIAGNOSES  Final diagnoses:  Hypertension, unspecified type  Bad headache     ED Discharge Orders    None      *Please note:  Antonio Mcclain was evaluated in Emergency Department on 08/26/2020 for the symptoms described in the history of present illness. He was evaluated in the context of the global COVID-19 pandemic, which necessitated consideration that the patient might be at risk for infection with the SARS-CoV-2 virus that causes COVID-19. Institutional protocols and algorithms that pertain to the evaluation of patients at risk for COVID-19 are in a state of rapid change based on information released by regulatory bodies including the CDC and federal and state organizations. These policies and algorithms were followed during the patient's care in the ED.  Some ED evaluations and interventions may be delayed as a result of limited staffing during and the pandemic.*   Note:  This document was prepared using Dragon voice recognition software and may include unintentional dictation errors.    Joni Reining, PA-C 08/26/20 1548    Concha Se, MD 08/27/20 [phone]

## 2020-08-26 NOTE — Discharge Instructions (Addendum)
Your initial readings on blood pressure was 150/108.  Status post 0.1 mg of clonidine your blood pressure is now 130/81.  No acute findings except for sinusitis on CT of the head.  Your headache resolved with medication.  Advised to follow-up with Everlean Alstrom primary care for further evaluation of elevated blood pressure.   Advised 3-day blood pressure check using form provided on your discharge.

## 2020-08-26 NOTE — ED Triage Notes (Signed)
Pt states that he woke with a pounding migraine; felt dizzy and confused; when he stand it all rushes forward; states hx of HTN, bp was 200/110 pulse 97 this morning at home, takes no blood pressure medication; took excedrin this morning.

## 2021-07-18 ENCOUNTER — Emergency Department
Admission: EM | Admit: 2021-07-18 | Discharge: 2021-07-18 | Disposition: A | Discharge status: Home / Self Care | Payer: Self-pay | Attending: Emergency Medicine | Admitting: Emergency Medicine

## 2021-07-18 ENCOUNTER — Other Ambulatory Visit: Payer: Self-pay

## 2021-07-18 DIAGNOSIS — J069 Acute upper respiratory infection, unspecified: Secondary | ICD-10-CM | POA: Insufficient documentation

## 2021-07-18 DIAGNOSIS — Z20822 Contact with and (suspected) exposure to covid-19: Secondary | ICD-10-CM | POA: Insufficient documentation

## 2021-07-18 DIAGNOSIS — R197 Diarrhea, unspecified: Secondary | ICD-10-CM | POA: Insufficient documentation

## 2021-07-18 LAB — RESP PANEL BY RT-PCR (FLU A&B, COVID) ARPGX2
Influenza A by PCR: NEGATIVE
Influenza B by PCR: NEGATIVE
SARS Coronavirus 2 by RT PCR: NEGATIVE

## 2021-07-18 MED ORDER — ALBUTEROL SULFATE HFA 108 (90 BASE) MCG/ACT IN AERS
2.0000 | INHALATION_SPRAY | RESPIRATORY_TRACT | 0 refills | Status: DC | PRN
Start: 2021-07-18 — End: 2021-09-22

## 2021-07-18 MED ORDER — PSEUDOEPH-BROMPHEN-DM 30-2-10 MG/5ML PO SYRP: 5.0000 mL | ORAL_SOLUTION | Freq: Four times a day (QID) | ORAL | 0 refills | Status: DC | PRN

## 2021-07-18 MED ORDER — FLUTICASONE PROPIONATE 50 MCG/ACT NA SUSP
1.0000 | Freq: Two times a day (BID) | NASAL | 0 refills | Status: AC
Start: 2021-07-18 — End: 2021-08-17

## 2021-07-18 MED ORDER — PREDNISONE 20 MG PO TABS: 40.0000 mg | ORAL_TABLET | Freq: Every day | ORAL | 0 refills | Status: AC

## 2021-07-18 NOTE — ED Triage Notes (Signed)
Pt to ED for cough, sore throat, congestion since Friday. NAD noted.

## 2021-07-18 NOTE — Discharge Instructions (Signed)
Your Covid and flu tests are negative. Take the prescription meds as directed. Follow-up with your primary provider for continued symptoms.

## 2021-07-18 NOTE — ED Provider Notes (Signed)
[facility] Emergency Department Provider Note     Event Date/Time   First MD Initiated Contact with Patient 07/18/21 1122     (approximate)   History   URI   HPI  Antonio Mcclain is a 41 y.o. male presents to the ED for evaluation of cough, congestion , and sore throat. He notes symptoms for the last 3 days.  He denies any associated fevers, nausea, or vomiting.  He has endorsed some diarrhea.  He denies any significant benefit with OTC medicines including Mucinex, allergy medicine, and Sudafed PE.      Physical Exam   Triage Vital Signs: ED Triage Vitals  Enc Vitals Group     BP 07/18/21 1100 (!) 154/100     Pulse Rate 07/18/21 1058 79     Resp 07/18/21 1058 18     Temp 07/18/21 1058 98.6 F (37 C)     Temp src --      SpO2 07/18/21 1058 97 %     Weight 07/18/21 1058 205 lb (93 kg)     Height 07/18/21 1058 6\' 3"  (1.905 m)     Head Circumference --      Peak Flow --      Pain Score 07/18/21 1058 0     Pain Loc --      Pain Edu? --      Excl. in GC? --     Most recent vital signs: Vitals:   07/18/21 1058 07/18/21 1100  BP:  (!) 154/100  Pulse: 79   Resp: 18   Temp: 98.6 F (37 C)   SpO2: 97%     General Awake, no distress.  Head:  No acute traumatic findings Eyes:   PERRL. EOMI. CV:  Good peripheral perfusion.  RESP:  Normal effort.  ABD:  No distention.   ED Results / Procedures / Treatments   Labs (all labs ordered are listed, but only abnormal results are displayed) Labs Reviewed  RESP PANEL BY RT-PCR (FLU A&B, COVID) ARPGX2     EKG   RADIOLOGY  No results found.   PROCEDURES:  Critical Care performed: No  Procedures   MEDICATIONS ORDERED IN ED: Medications - No data to display   IMPRESSION / MDM / ASSESSMENT AND PLAN / ED COURSE  I reviewed the triage vital signs and the nursing notes.                              Differential diagnosis includes, but is not limited to, viral URI, influenza,  Covid  Patient ED evaluation of several days of cough, congestion, and sinus drainage.  Patient's exam is reassuring as it shows no acute respiratory distress, dehydration, or toxic appearance.  Patient is afebrile on presentation.  Patient's diagnosis is consistent with viral URI, as his viral panel test is negative for COVID and flu. Patient will be discharged home with prescriptions for albuterol inhaler, Bromfed syrup, Flonase, and prednisone. Patient is to follow up with his PCP or local community clinic as needed or otherwise directed. Patient is given ED precautions to return to the ED for any worsening or new symptoms.  A work note is provided for 2 days.   FINAL CLINICAL IMPRESSION(S) / ED DIAGNOSES   Final diagnoses:  Viral URI with cough     Rx / DC Orders   ED Discharge Orders  Ordered    fluticasone (FLONASE) 50 MCG/ACT nasal spray  2 times daily        07/18/21 1157    brompheniramine-pseudoephedrine-DM 30-2-10 MG/5ML syrup  4 times daily PRN        07/18/21 1157    albuterol (VENTOLIN HFA) 108 (90 Base) MCG/ACT inhaler  Every 4 hours PRN        07/18/21 1157    predniSONE (DELTASONE) 20 MG tablet  Daily with breakfast        07/18/21 1157             Note:  This document was prepared using Dragon voice recognition software and may include unintentional dictation errors.    Lissa Hoard, PA-C 07/18/21 1207    Merwyn Katos, MD 07/19/21 [phone]

## 2021-09-22 ENCOUNTER — Encounter: Payer: Self-pay | Admitting: Emergency Medicine

## 2021-09-22 ENCOUNTER — Other Ambulatory Visit: Payer: Self-pay

## 2021-09-22 ENCOUNTER — Emergency Department: Payer: Self-pay

## 2021-09-22 ENCOUNTER — Emergency Department
Admission: EM | Admit: 2021-09-22 | Discharge: 2021-09-22 | Disposition: A | Discharge status: Home / Self Care | Payer: Self-pay | Attending: Emergency Medicine | Admitting: Emergency Medicine

## 2021-09-22 DIAGNOSIS — J209 Acute bronchitis, unspecified: Secondary | ICD-10-CM | POA: Insufficient documentation

## 2021-09-22 DIAGNOSIS — Z20822 Contact with and (suspected) exposure to covid-19: Secondary | ICD-10-CM | POA: Insufficient documentation

## 2021-09-22 DIAGNOSIS — I10 Essential (primary) hypertension: Secondary | ICD-10-CM | POA: Insufficient documentation

## 2021-09-22 LAB — RESP PANEL BY RT-PCR (FLU A&B, COVID) ARPGX2
Influenza A by PCR: NEGATIVE
Influenza B by PCR: NEGATIVE
SARS Coronavirus 2 by RT PCR: NEGATIVE

## 2021-09-22 MED ORDER — BENZONATATE 100 MG PO CAPS: 100.0000 mg | ORAL_CAPSULE | Freq: Three times a day (TID) | ORAL | 0 refills | Status: DC | PRN

## 2021-09-22 MED ORDER — ALBUTEROL SULFATE HFA 108 (90 BASE) MCG/ACT IN AERS: 2.0000 | INHALATION_SPRAY | Freq: Four times a day (QID) | RESPIRATORY_TRACT | 2 refills | Status: AC | PRN

## 2021-09-22 NOTE — ED Triage Notes (Signed)
Pt in via POV, reports cough, congestion x 1 day.  Also reports some chest pain associated with cough.  States he contacted his work, who advised he be checked out before coming in to work.   ? ?Ambulatory to triage, NAD noted at this time. ?

## 2021-09-22 NOTE — ED Provider Notes (Signed)
? ?[facility] ?Provider Note ? ? ? Event Date/Time  ? First MD Initiated Contact with Patient 09/22/21 1629   ?  (approximate) ? ? ?History  ? ?Chief Complaint ?Cough ? ? ?HPI ?Antonio Mcclain is a 41 y.o. male, history of hypertension, presents to the emergency department for evaluation of flulike symptoms.  Patient reports cough, congestion, and chest tightness for the past 48 hours.  He states that he has been around several people have been sick, though no particular diagnosis.  Denies fever/chills, chest pain, abdominal pain, flank pain, nausea/vomiting, diarrhea, urinary symptoms, or dizziness/lightheadedness. ? ?History Limitations: No limitations. ? ?    ? ? ?Physical Exam  ?Triage Vital Signs: ?ED Triage Vitals  ?Enc Vitals Group  ?   BP 09/22/21 1546 (!) 148/86  ?   Pulse Rate 09/22/21 1546 85  ?   Resp 09/22/21 1546 18  ?   Temp 09/22/21 1546 98.7 ?F (37.1 ?C)  ?   Temp Source 09/22/21 1546 Oral  ?   SpO2 09/22/21 1546 96 %  ?   Weight 09/22/21 1546 210 lb (95.3 kg)  ?   Height 09/22/21 1546 6\' 3"  (1.905 m)  ?   Head Circumference --   ?   Peak Flow --   ?   Pain Score 09/22/21 1546 0  ?   Pain Loc --   ?   Pain Edu? --   ?   Excl. in [place]? --   ? ? ?Most recent vital signs: ?Vitals:  ? 09/22/21 1546  ?BP: (!) 148/86  ?Pulse: 85  ?Resp: 18  ?Temp: 98.7 ?F (37.1 ?C)  ?SpO2: 96%  ? ? ?General: Awake, NAD.  ?Skin: Warm, dry. No rashes or lesions.  ?Eyes: PERRL. Conjunctivae normal.  ?CV: Good peripheral perfusion.  ?Resp: Normal effort.  Lung sounds are clear bilaterally, no notable rhonchi or rales. ?Abd: Soft, non-tender. No distention.  ?Neuro: At baseline. No gross neurological deficits.  ? ?Focused Exam: Throat is mildly erythematous. ? ?Physical Exam ? ? ? ?ED Results / Procedures / Treatments  ?Labs ?(all labs ordered are listed, but only abnormal results are displayed) ?Labs Reviewed  ?RESP PANEL BY RT-PCR (FLU A&B, COVID) ARPGX2  ? ? ? ?EKG ?Not applicable ? ? ?RADIOLOGY ? ?ED  Provider Interpretation: I personally viewed this chest x-ray, no evidence of pneumonia based on my interpretation. ? ?DG Chest 2 View ? ?Result Date: 09/22/2021 ?CLINICAL DATA:  Chest pain and cough. EXAM: CHEST - 2 VIEW COMPARISON:  April 24, 2020 FINDINGS: The heart size and mediastinal contours are within normal limits. Both lungs are clear. The visualized skeletal structures are unremarkable. IMPRESSION: No active cardiopulmonary disease. Electronically Signed   By: Virgina Norfolk M.D.   On: 09/22/2021 16:51   ? ?PROCEDURES: ? ?Critical Care performed: None. ? ?Procedures ? ? ? ?MEDICATIONS ORDERED IN ED: ?Medications - No data to display ? ? ?IMPRESSION / MDM / ASSESSMENT AND PLAN / ED COURSE  ?I reviewed the triage vital signs and the nursing notes. ?             ?               ? ?Differential diagnosis includes, but is not limited to, COVID-19, influenza, viral URI, bronchitis. ? ?ED Course ?Patient appears well.  Vitals within normal limits for the patient, NAD ? ?Respiratory panel negative for COVID-19 or influenza. ? ?Assessment/Plan ?Presentation consistent with bronchitis.  Chest x-ray shows  no evidence of pneumonia.  Very low suspicion for any serious or life-threatening pathology warranting further work-up.  Given his symptoms, will prescribe him albuterol inhaler as well as benzonatate.  Encouraged him to utilize Tylenol and Mucinex as well.  We will plan to discharge. ? ?Provided the patient with anticipatory guidance, return precautions, and educational material. Encouraged the patient to return to the emergency department at any time if they begin to experience any new or worsening symptoms. Patient expressed understanding and agreed with the plan.  ? ?  ? ? ?FINAL CLINICAL IMPRESSION(S) / ED DIAGNOSES  ? ?Final diagnoses:  ?Acute bronchitis, unspecified organism  ? ? ? ?Rx / DC Orders  ? ?ED Discharge Orders   ? ?      Ordered  ?  albuterol (VENTOLIN HFA) 108 (90 Base) MCG/ACT inhaler   Every 6 hours PRN       ? 09/22/21 1714  ?  benzonatate (TESSALON PERLES) 100 MG capsule  3 times daily PRN       ? 09/22/21 1714  ? ?  ?  ? ?  ? ? ? ?Note:  This document was prepared using Dragon voice recognition software and may include unintentional dictation errors. ?  ?Teodoro Spray, [place] ?09/22/21 1721 ? ?  ?Lavonia Drafts, MD ?09/22/21 1751 ? ?

## 2021-09-22 NOTE — Discharge Instructions (Addendum)
-  Take albuterol and benzonatate as needed for cough ?-Continue to hydrate and take OTC medications as needed ?-Return to the ED at any time if you begin to experience any new or worsening symptoms.  ?

## 2021-09-22 NOTE — ED Notes (Signed)
Pt with cough and sinus drainage since last night. Pt states he has been coughing so much that his chest hurts. Pt talking in complete sentences, NAD at this time.  ?

## 2022-01-02 ENCOUNTER — Other Ambulatory Visit: Payer: Self-pay

## 2022-01-02 ENCOUNTER — Emergency Department
Admission: EM | Admit: 2022-01-02 | Discharge: 2022-01-02 | Disposition: A | Discharge status: Home / Self Care | Payer: Self-pay | Attending: Emergency Medicine | Admitting: Emergency Medicine

## 2022-01-02 ENCOUNTER — Emergency Department: Payer: Self-pay

## 2022-01-02 ENCOUNTER — Encounter: Payer: Self-pay | Admitting: Intensive Care

## 2022-01-02 DIAGNOSIS — M25532 Pain in left wrist: Secondary | ICD-10-CM

## 2022-01-02 NOTE — ED Triage Notes (Signed)
Patient c/o left wrist pain. Denies recent injury. Patient is wearing wrist brace

## 2022-01-02 NOTE — Discharge Instructions (Signed)
You may wear the wrist brace while using your wrist, please remove at night.  Continue to take ibuprofen 600 mg every 6-8 hours as needed for pain.

## 2022-01-02 NOTE — ED Provider Notes (Signed)
[facility] Provider Note    Event Date/Time   First MD Initiated Contact with Patient 01/02/22 1540     (approximate)   History   Wrist Pain   HPI  Antonio Mcclain is a 41 y.o. male who presents today for evaluation of left wrist pain.  Patient reports that this has been ongoing for several months.  He reports that his pain is worse when he supinates or pronates his hand.  He reports that this pain is primarily on the ulnar side of his wrist and radiates into his fourth and fifth fingers.  He denies any pain in his neck or upper arm.  He reports that the pain radiates into his forearm only.  No injuries.  No weakness.  There are no problems to display for this patient.         Physical Exam   Triage Vital Signs: ED Triage Vitals  Enc Vitals Group     BP 01/02/22 1518 (!) 144/92     Pulse Rate 01/02/22 1518 75     Resp 01/02/22 1518 18     Temp 01/02/22 1518 98.7 F (37.1 C)     Temp Source 01/02/22 1518 Oral     SpO2 01/02/22 1518 99 %     Weight 01/02/22 1515 198 lb (89.8 kg)     Height 01/02/22 1515 6' (1.829 m)     Head Circumference --      Peak Flow --      Pain Score 01/02/22 1515 7     Pain Loc --      Pain Edu? --      Excl. in [place]? --     Most recent vital signs: Vitals:   01/02/22 1518 01/02/22 1710  BP: (!) 144/92 (!) 140/88  Pulse: 75 73  Resp: 18 18  Temp: 98.7 F (37.1 C)   SpO2: 99% 99%    Physical Exam Vitals and nursing note reviewed.  Constitutional:      General: Awake and alert. No acute distress.    Appearance: Normal appearance. The patient is normal weight.  HENT:     Head: Normocephalic and atraumatic.     Mouth: Mucous membranes are moist.  Eyes:     General: PERRL. Normal EOMs        Right eye: No discharge.        Left eye: No discharge.     Conjunctiva/sclera: Conjunctivae normal.  Cardiovascular:     Rate and Rhythm: Normal rate and regular rhythm.     Pulses: Normal pulses.     Heart  sounds: Normal heart sounds Pulmonary:     Effort: Pulmonary effort is normal. No respiratory distress.     Breath sounds: Normal breath sounds.  Abdominal:     Abdomen is soft. There is no abdominal tenderness. No rebound or guarding. No distention. Musculoskeletal:        General: No swelling. Normal range of motion.     Cervical back: Normal range of motion and neck supple.  Left wrist: Normal-appearing hand and wrist.  Normal radial and ulnar pulses.  Negative Allen's test.  Patient is able to abduct and adduct thumb normally.  Able to touch thumb to each individual finger tip fully and painlessly.  He is able to perform resisted pronation and resisted supination with mild discomfort.  No snuffbox tenderness.  Normal grip strength.  Normal intrinsic muscle function of the hand.  Reproduction of symptoms with Tinel's sign  and Phalen sign.  He has tap tenderness at the level of the elbow.  Full and normal range of motion of the elbow.  No warmth or erythema to the joint.  No open wounds.   Negative Spurling sign, negative Lhermitte sign, negative Hoffmann test. Skin:    General: Skin is warm and dry.     Capillary Refill: Capillary refill takes less than 2 seconds.     Findings: No rash.  Neurological:     Mental Status: The patient is awake and alert.      ED Results / Procedures / Treatments   Labs (all labs ordered are listed, but only abnormal results are displayed) Labs Reviewed - No data to display   EKG     RADIOLOGY I independently reviewed and interpreted imaging and agree with radiologists findings.     PROCEDURES:  Critical Care performed:   Procedures   MEDICATIONS ORDERED IN ED: Medications - No data to display   IMPRESSION / MDM / ASSESSMENT AND PLAN / ED COURSE  I reviewed the triage vital signs and the nursing notes.   Differential diagnosis includes, but is not limited to, ulnar nerve entrapment, cervical radiculopathy, wrist sprain, less likely  fracture.  No constitutional symptoms, warmth, effusion, or erythema to suggest septic wrist.  Timeline also not consistent with septic wrist. Patient is awake and alert, hemodynamically stable and afebrile.  His intermittent paresthesias in his fourth and fifth fingers are suggestive of possible ulnar nerve entrapment.  He has no symptoms above the level of the elbow, negative Spurling sign and Lhermitte sign, negative Hoffmann test, do not suspect cervical spine etiology.  He has normal grip strength, normal intrinsic muscle function of the hand, no neuro compromise.  Negative Allen's test, no vascular compromise.  X-ray was negative for any acute bony injury.  Patient was placed in a wrist splint and instructed to follow-up with orthopedics.  He reports that he has ibuprofen and naproxen which she has been taking, I advised that he take 1 or the other given the risk of kidney or stomach injury.  Patient understands and agrees.  He was also advised to ice his wrist.  He was given a work note as requested to help rest his wrist.  The appropriate information was given for follow-up.  We discussed return precautions.  Patient understands and agrees with plan.  Discharged in stable condition.   Patient's presentation is most consistent with acute complicated illness / injury requiring diagnostic workup.      FINAL CLINICAL IMPRESSION(S) / ED DIAGNOSES   Final diagnoses:  Left wrist pain     Rx / DC Orders   ED Discharge Orders     None        Note:  This document was prepared using Dragon voice recognition software and may include unintentional dictation errors.   Keturah Shavers 01/02/22 1743    Chesley Noon, MD 01/02/22 2240

## 2022-01-04 ENCOUNTER — Emergency Department
Admission: EM | Admit: 2022-01-04 | Discharge: 2022-01-04 | Disposition: A | Discharge status: Home / Self Care | Payer: Self-pay | Attending: Emergency Medicine | Admitting: Emergency Medicine

## 2022-01-04 ENCOUNTER — Other Ambulatory Visit: Payer: Self-pay

## 2022-01-04 ENCOUNTER — Encounter: Payer: Self-pay | Admitting: Emergency Medicine

## 2022-01-04 DIAGNOSIS — M25532 Pain in left wrist: Secondary | ICD-10-CM | POA: Insufficient documentation

## 2022-01-04 MED ORDER — PREDNISONE 10 MG PO TABS: ORAL_TABLET | ORAL | 0 refills | Status: DC

## 2022-01-04 NOTE — Discharge Instructions (Addendum)
Call today and make an appointment for Dr. Allena Katz.  Start prednisone as directed.  Wear splint.

## 2022-01-04 NOTE — ED Notes (Signed)
See triage note  Presents with cont'd pain to left wrist   Was 2 days ago for same   is currently wearing wrist splint   States he thinks his wrist is swollen  did have some numbness to 5th finger this am  Good pulses   no deformity

## 2022-01-04 NOTE — ED Provider Notes (Signed)
[facility] Provider Note    None    (approximate)   History   Wrist Pain (/)   HPI  Antonio Mcclain is a 41 y.o. male   presents to the ED with complaint of left wrist pain for 1 month.  Patient states that he was seen in the emergency department at which time he was given a brace to wear and told to follow-up with an orthopedist.  Patient has not called the office and was waiting until the first week in August before he called.  He states that this morning he had some numbness to his fingers which is now resolved.  Patient wears his splint with the exception of at bedtime.  No reinjury to his hand.  X-rays were reviewed and were negative at the time he was seen on 01/02/2022.      Physical Exam   Triage Vital Signs: ED Triage Vitals  Enc Vitals Group     BP 01/04/22 0821 138/80     Pulse Rate 01/04/22 0821 70     Resp 01/04/22 0821 18     Temp 01/04/22 0821 98 F (36.7 C)     Temp src --      SpO2 01/04/22 0821 99 %     Weight 01/04/22 0808 196 lb 3.4 oz (89 kg)     Height 01/04/22 0808 6' (1.829 m)     Head Circumference --      Peak Flow --      Pain Score 01/04/22 0808 7     Pain Loc --      Pain Edu? --      Excl. in [place]? --     Most recent vital signs: Vitals:   01/04/22 0821  BP: 138/80  Pulse: 70  Resp: 18  Temp: 98 F (36.7 C)  SpO2: 99%     General: Awake, no distress.  CV:  Good peripheral perfusion.  Resp:  Normal effort.  Abd:  No distention.  Other:  Left hand examination is essentially negative.  There is no point tenderness on palpation.  No soft tissue edema or discoloration noted.  Patient has slow range of motion but is able to fully flex and extend his hand at the wrist and also each digit.  Capillary refills less than 3 seconds.  Skin is intact.  Radial pulse present.   ED Results / Procedures / Treatments   Labs (all labs ordered are listed, but only abnormal results are displayed) Labs Reviewed - No data  to display    PROCEDURES:  Critical Care performed:   Procedures   MEDICATIONS ORDERED IN ED: Medications - No data to display   IMPRESSION / MDM / ASSESSMENT AND PLAN / ED COURSE  I reviewed the triage vital signs and the nursing notes.   Differential diagnosis includes, but is not limited to, left wrist pain, left hand pain, carpal tunnel syndrome.  41 year old male presents to the ED after being seen in the ED and x-rays were done of his hand on 01/02/2022.  Patient states that he has been taking ibuprofen and wearing his wrist brace.  Patient has not called to make an appoint with orthopedist.  Today x-rays were reviewed and patient was reassured that there was no bone injury noted however he would need further studies to evaluate his complaint.  Patient will discontinue taking ibuprofen and a prescription for prednisone was sent to his pharmacy.  He is to continue wearing his  wrist brace.  He is strongly encouraged to call and make an appoint with Dr. Allena Katz who is the orthopedist that he was originally instructed to call make an appointment.      Patient's presentation is most consistent with Patient's presentation is most consistent with acute, uncomplicated illness.  FINAL CLINICAL IMPRESSION(S) / ED DIAGNOSES   Final diagnoses:  Acute pain of left wrist     Rx / DC Orders   ED Discharge Orders          Ordered    predniSONE (DELTASONE) 10 MG tablet        01/04/22 0901             Note:  This document was prepared using Dragon voice recognition software and may include unintentional dictation errors.   Tommi Rumps, PA-C 01/04/22 1535    Gilles Chiquito, MD 01/04/22 1700

## 2022-01-04 NOTE — ED Triage Notes (Signed)
Pt to ED via POV c/o left wrist pain x 1 month. Pt has brace on at this time. Pt states that the pain has started radiating down into his elbow and when he woke up this morning his hand was swelling and tingling. Pt is currently in NAD.

## 2022-01-30 ENCOUNTER — Emergency Department: Payer: Self-pay

## 2022-01-30 ENCOUNTER — Encounter: Payer: Self-pay | Admitting: Emergency Medicine

## 2022-01-30 ENCOUNTER — Other Ambulatory Visit: Payer: Self-pay

## 2022-01-30 DIAGNOSIS — J029 Acute pharyngitis, unspecified: Secondary | ICD-10-CM | POA: Insufficient documentation

## 2022-01-30 DIAGNOSIS — R509 Fever, unspecified: Secondary | ICD-10-CM | POA: Insufficient documentation

## 2022-01-30 DIAGNOSIS — Z5321 Procedure and treatment not carried out due to patient leaving prior to being seen by health care provider: Secondary | ICD-10-CM | POA: Insufficient documentation

## 2022-01-30 DIAGNOSIS — Z20822 Contact with and (suspected) exposure to covid-19: Secondary | ICD-10-CM | POA: Insufficient documentation

## 2022-01-30 DIAGNOSIS — R059 Cough, unspecified: Secondary | ICD-10-CM | POA: Insufficient documentation

## 2022-01-30 NOTE — ED Triage Notes (Signed)
Patient ambulatory to triage with steady gait, without difficulty or distress noted; pt reports since Saturday having sore throat, prod cough brown sputum and fever

## 2022-01-31 ENCOUNTER — Emergency Department: Payer: Self-pay

## 2022-01-31 ENCOUNTER — Emergency Department
Admission: EM | Admit: 2022-01-31 | Discharge: 2022-01-31 | Discharge status: Against Medical Advice | Payer: Self-pay | Attending: Emergency Medicine | Admitting: Emergency Medicine

## 2022-01-31 LAB — GROUP A STREP BY PCR: Group A Strep by PCR: NOT DETECTED

## 2022-01-31 LAB — SARS CORONAVIRUS 2 BY RT PCR: SARS Coronavirus 2 by RT PCR: NEGATIVE

## 2022-04-18 ENCOUNTER — Emergency Department
Admission: EM | Admit: 2022-04-18 | Discharge: 2022-04-18 | Disposition: A | Discharge status: Home / Self Care | Payer: Self-pay | Attending: Emergency Medicine | Admitting: Emergency Medicine

## 2022-04-18 ENCOUNTER — Encounter: Payer: Self-pay | Admitting: Emergency Medicine

## 2022-04-18 ENCOUNTER — Other Ambulatory Visit: Payer: Self-pay

## 2022-04-18 DIAGNOSIS — Z20822 Contact with and (suspected) exposure to covid-19: Secondary | ICD-10-CM | POA: Insufficient documentation

## 2022-04-18 DIAGNOSIS — J069 Acute upper respiratory infection, unspecified: Secondary | ICD-10-CM | POA: Insufficient documentation

## 2022-04-18 LAB — RESP PANEL BY RT-PCR (FLU A&B, COVID) ARPGX2
Influenza A by PCR: NEGATIVE
Influenza B by PCR: NEGATIVE
SARS Coronavirus 2 by RT PCR: NEGATIVE

## 2022-04-18 MED ORDER — BENZONATATE 200 MG PO CAPS: 200.0000 mg | ORAL_CAPSULE | Freq: Three times a day (TID) | ORAL | 0 refills | Status: AC | PRN

## 2022-04-18 NOTE — Discharge Instructions (Signed)
Follow-up with your primary care provider if any continued problems or concerns.  Test results for your COVID and influenza test are negative.  Increase fluids to stay hydrated.  A prescription for Tessalon Perles was sent to the pharmacy to take every 8 hours if needed for cough.

## 2022-04-18 NOTE — ED Provider Notes (Signed)
[facility] Provider Note    Event Date/Time   First MD Initiated Contact with Patient 04/18/22 1222     (approximate)   History   Cough   HPI  Antonio Mcclain is a 41 y.o. male presents to the ED with complaint of upper respiratory symptoms and cough.  Was told at work that one of his coworkers tested positive for COVID on Friday.  Patient woke at 2 AM this morning with a dry cough and sinus pressure.  He denies any fever, chills, nausea, vomiting or diarrhea.     Physical Exam   Triage Vital Signs: ED Triage Vitals  Enc Vitals Group     BP 04/18/22 1205 (!) 149/104     Pulse Rate 04/18/22 1205 73     Resp 04/18/22 1205 18     Temp 04/18/22 1205 97.8 F (36.6 C)     Temp Source 04/18/22 1205 Oral     SpO2 04/18/22 1205 100 %     Weight 04/18/22 1202 210 lb (95.3 kg)     Height 04/18/22 1202 6\' 3"  (1.905 m)     Head Circumference --      Peak Flow --      Pain Score 04/18/22 1202 5     Pain Loc --      Pain Edu? --      Excl. in [place]? --     Most recent vital signs: Vitals:   04/18/22 1205 04/18/22 1312  BP: (!) 149/104 (!) 150/91  Pulse: 73 99  Resp: 18 16  Temp: 97.8 F (36.6 C)   SpO2: 100% 99%     General: Awake, no distress.  CV:  Good peripheral perfusion.  Heart regular rate and rhythm. Resp:  Normal effort.  Lungs are clear bilaterally. Abd:  No distention.  Other:     ED Results / Procedures / Treatments   Labs (all labs ordered are listed, but only abnormal results are displayed) Labs Reviewed  RESP PANEL BY RT-PCR (FLU A&B, COVID) ARPGX2      PROCEDURES:  Critical Care performed:   Procedures   MEDICATIONS ORDERED IN ED: Medications - No data to display   IMPRESSION / MDM / ASSESSMENT AND PLAN / ED COURSE  I reviewed the triage vital signs and the nursing notes.   Differential diagnosis includes, but is not limited to, COVID, influenza, upper respiratory infection.  41 year old male presents to  the ED with complaint of cough since 2 AM this morning.  He states that he was exposed to someone at work with COVID.  Patient was reassured when his influenza and COVID test were negative.  Patient was encouraged to drink fluids and take Tylenol if needed.  A prescription for Tessalon Perles was sent to the pharmacy to take every 8 hours as needed for cough and patient is able to return to work tomorrow.      Patient's presentation is most consistent with acute complicated illness / injury requiring diagnostic workup.  FINAL CLINICAL IMPRESSION(S) / ED DIAGNOSES   Final diagnoses:  Viral URI with cough     Rx / DC Orders   ED Discharge Orders          Ordered    benzonatate (TESSALON) 200 MG capsule  3 times daily PRN        04/18/22 1305             Note:  This document was prepared using Dragon voice recognition  software and may include unintentional dictation errors.   Tommi Rumps, PA-C 04/18/22 1408    Jene Every, MD 04/18/22 1416

## 2022-04-18 NOTE — ED Triage Notes (Signed)
Patient to ED for cough since 2 am. Currently a dry cough with sinus pressure. NAD noted. Covid exposure from coworker.

## 2023-01-21 IMAGING — CT CT HEAD W/O CM
3 series · 16 of 47 positions shown, 19 images · non-contrast
Comparison: No pertinent prior exams available for comparison.

CLINICAL DATA: Headache, intracranial hemorrhage suspected.
Additional provided: Patient reports pounding migraine, dizziness,
confusion, history of hypertension.

EXAM:
CT HEAD WITHOUT CONTRAST
TECHNIQUE: Contiguous axial images were obtained from the base of the skull
through the vertex without intravenous contrast.

[Series 3: head wo · axial · 0.46mm/px · z∈[-137,-7]mm · 10 of 32 slices shown, 13 images]
[im 3/32  brain]
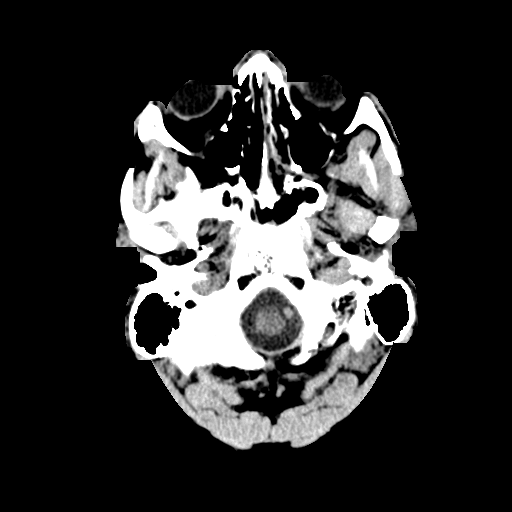
[im 3/32  bone]
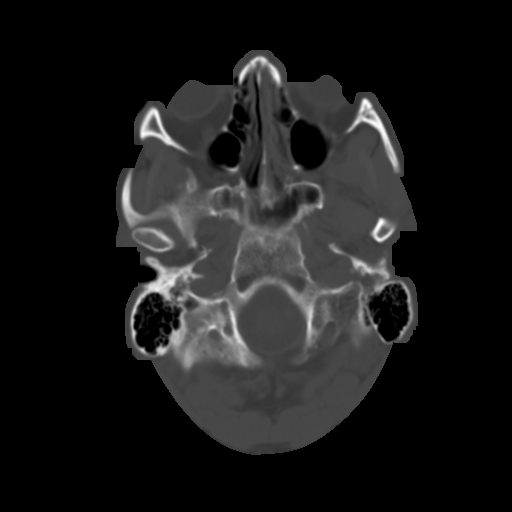
[im 6/32  brain]
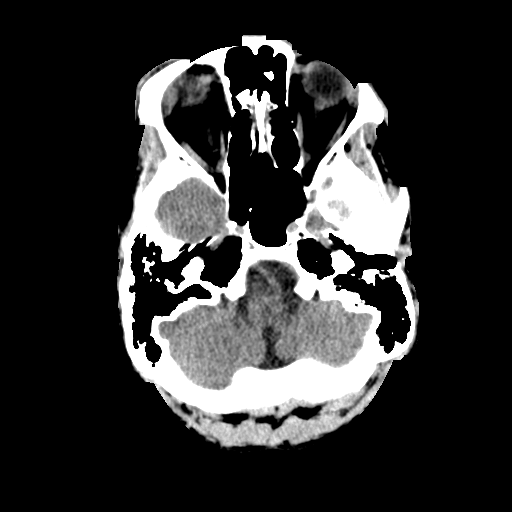
[im 9/32  brain]
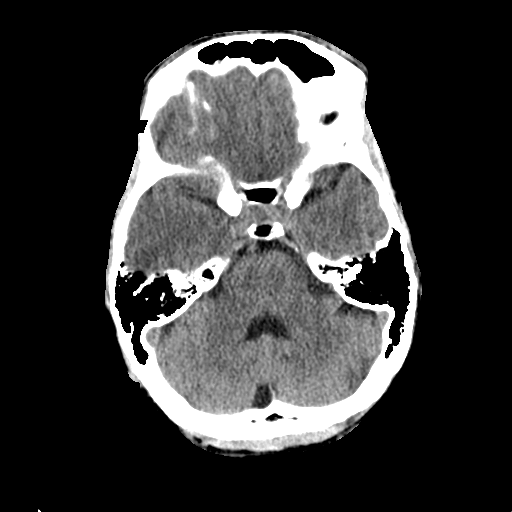
[im 11/32  brain]
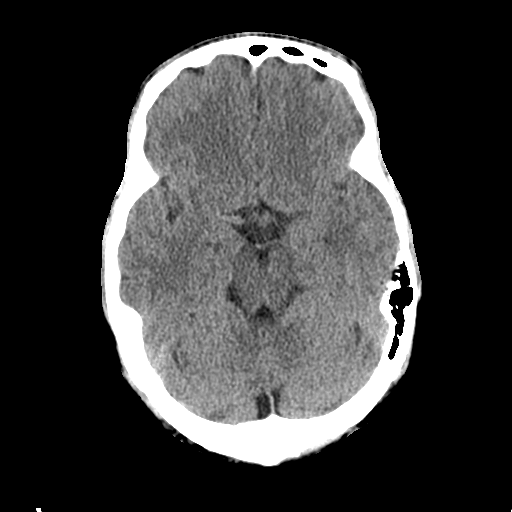
[im 14/32  brain]
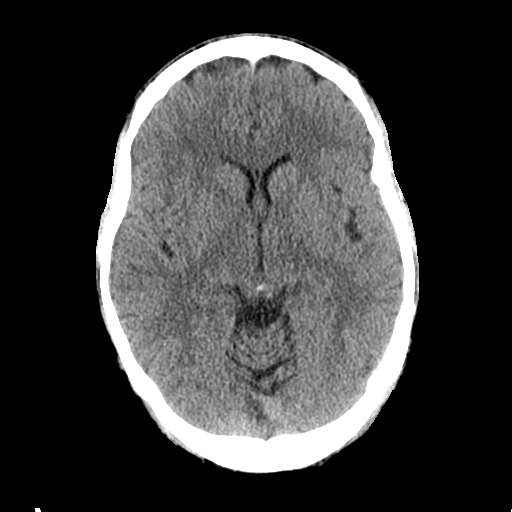
[im 14/32  bone]
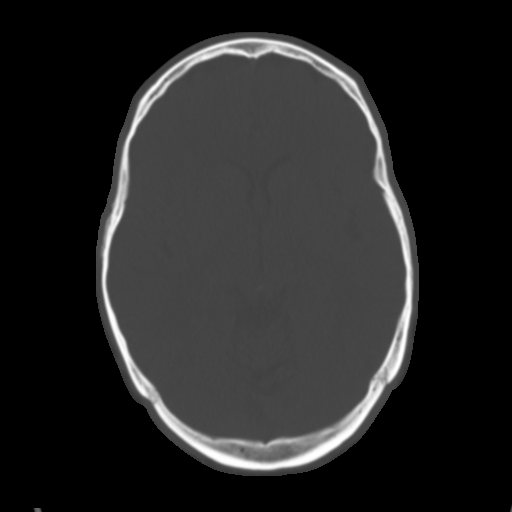
[im 18/32  brain]
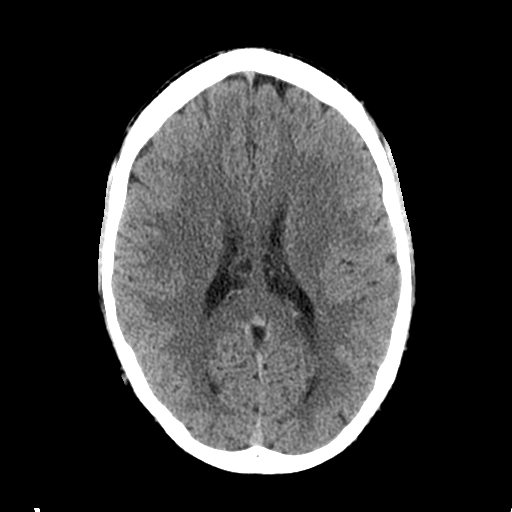
[im 21/32  brain]
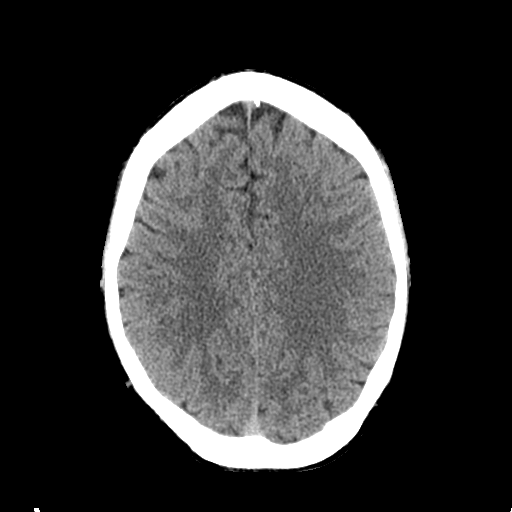
[im 24/32  brain]
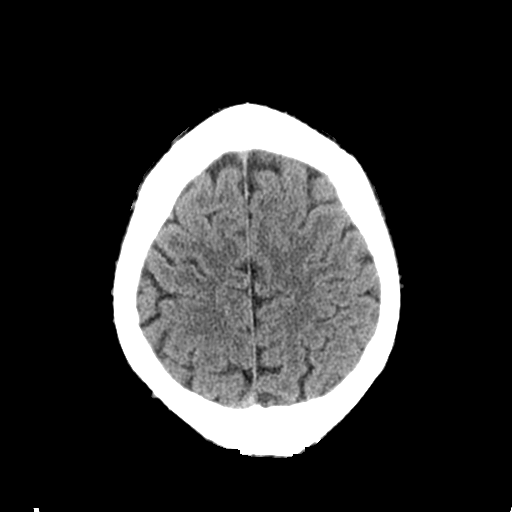
[im 26/32  brain]
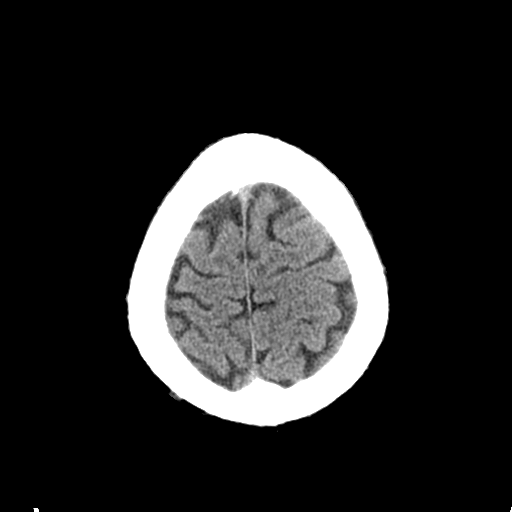
[im 26/32  bone]
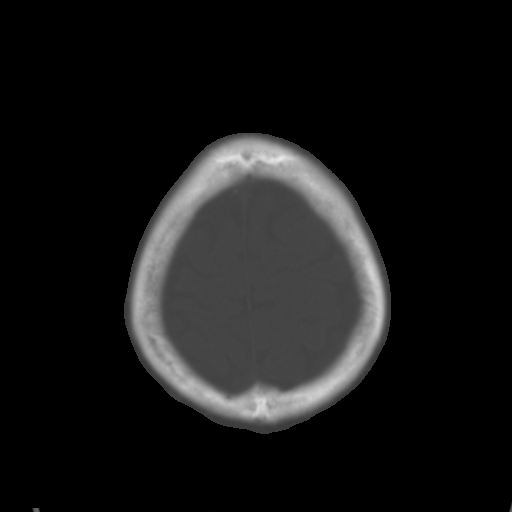
[im 29/32  brain]
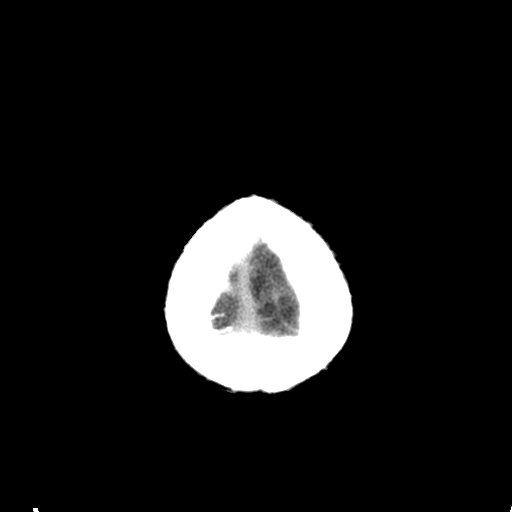

[Series 4: coronal soft tissue · coronal · 0.37mm/px · 3 of 74 slices shown]
[im 25/74  brain]
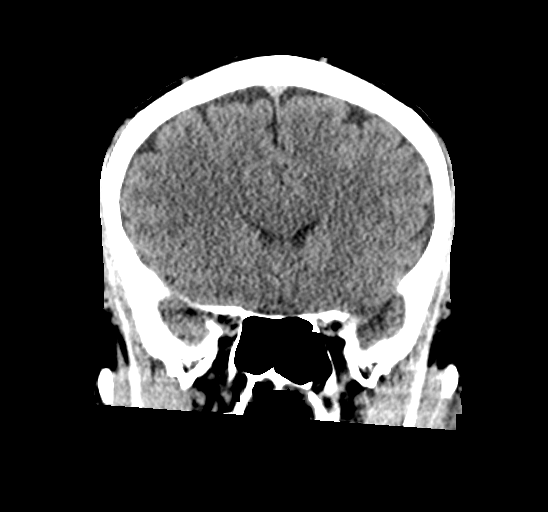
[im 33/74  brain]
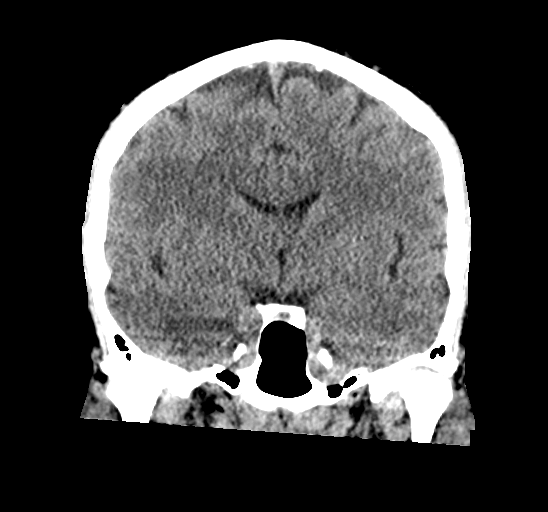
[im 41/74  brain]
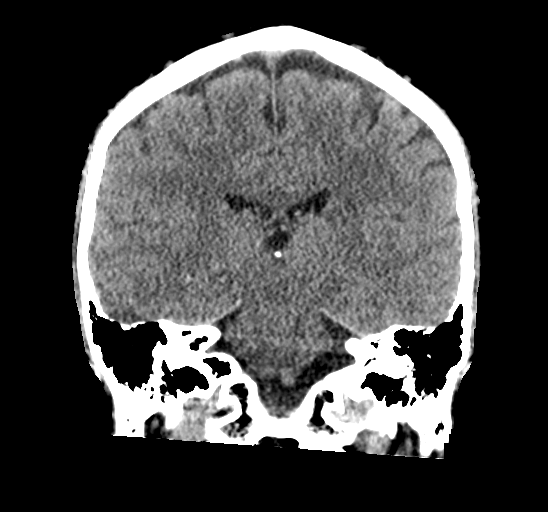

[Series 5: sagittal soft tissue · sagittal · 0.37mm/px · 3 of 56 slices shown]
[im 19/56  brain]
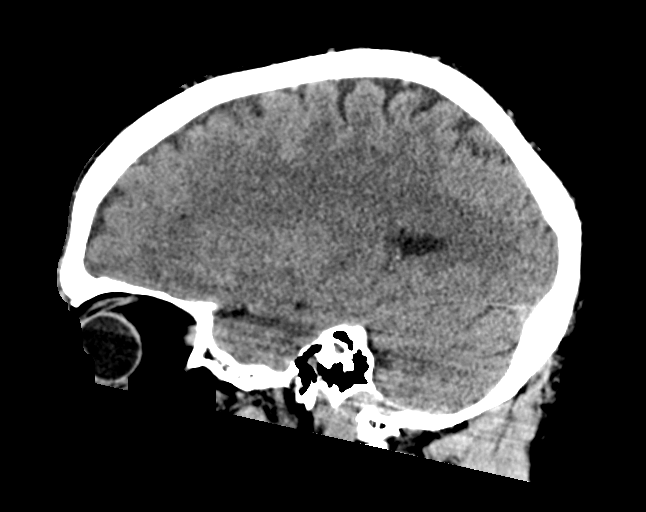
[im 28/56  brain]
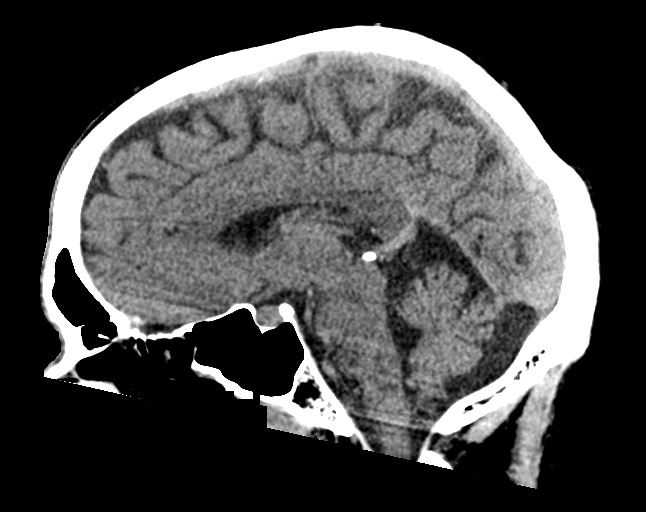
[im 37/56  brain]
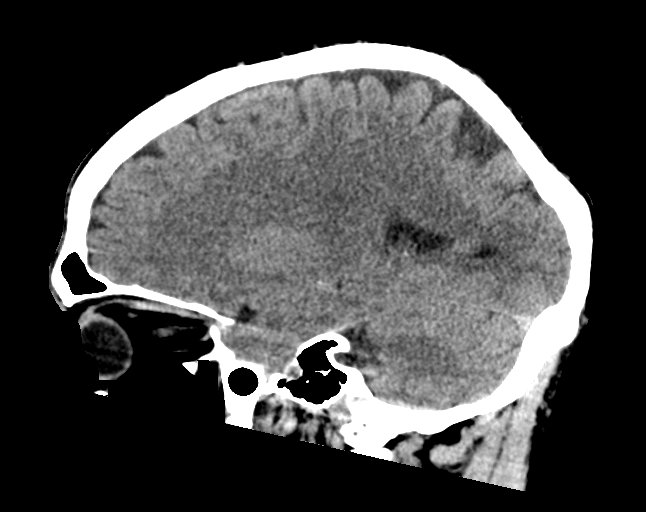

[16 of 47 positions shown; findings below may reference images not displayed]

FINDINGS: Brain:

Cerebral volume is normal for age.

There is no acute intracranial hemorrhage.

No demarcated cortical infarct.

No extra-axial fluid collection.

No evidence of intracranial mass.

No midline shift.

Vascular: No hyperdense vessel.

Skull: Normal. Negative for fracture or focal lesion.

Sinuses/Orbits: Visualized orbits show no acute finding. Mild
bilateral ethmoid sinus mucosal thickening.
IMPRESSION: No evidence of acute intracranial abnormality.

Mild bilateral ethmoid sinus mucosal thickening.

## 2023-03-08 ENCOUNTER — Other Ambulatory Visit: Payer: Self-pay

## 2023-03-08 ENCOUNTER — Encounter: Payer: Self-pay | Admitting: Emergency Medicine

## 2023-03-08 ENCOUNTER — Emergency Department
Admission: EM | Admit: 2023-03-08 | Discharge: 2023-03-08 | Disposition: A | Discharge status: Home / Self Care | Payer: 59 | Attending: Emergency Medicine | Admitting: Emergency Medicine

## 2023-03-08 DIAGNOSIS — M545 Low back pain, unspecified: Secondary | ICD-10-CM

## 2023-03-08 DIAGNOSIS — M62830 Muscle spasm of back: Secondary | ICD-10-CM | POA: Diagnosis not present

## 2023-03-08 DIAGNOSIS — M5459 Other low back pain: Secondary | ICD-10-CM | POA: Diagnosis not present

## 2023-03-08 MED ORDER — LIDOCAINE 5 % EX PTCH
1.0000 | MEDICATED_PATCH | Freq: Two times a day (BID) | CUTANEOUS | 0 refills | Status: AC
Start: 2023-03-08 — End: 2023-03-13

## 2023-03-08 MED ORDER — LIDOCAINE 5 % EX PTCH
1.0000 | MEDICATED_PATCH | CUTANEOUS | Status: DC
  Administered 2023-03-08: 1 via TRANSDERMAL
  Filled 2023-03-08: qty 1

## 2023-03-08 MED ORDER — KETOROLAC TROMETHAMINE 15 MG/ML IJ SOLN
15.0000 mg | Freq: Once | INTRAMUSCULAR | Status: AC
  Administered 2023-03-08: 15 mg via INTRAMUSCULAR
  Filled 2023-03-08: qty 1

## 2023-03-08 MED ORDER — NAPROXEN 500 MG PO TABS
500.0000 mg | ORAL_TABLET | Freq: Two times a day (BID) | ORAL | 0 refills | Status: AC
Start: 2023-03-08 — End: 2023-03-15

## 2023-03-08 NOTE — Discharge Instructions (Addendum)
You may take the medication as prescribed.  Please follow-up with your outpatient provider. Please return to the emergency department for any new, worsening, or changing symptoms or other concerns including weakness in your legs, urinary or stool incontinence or retention, numbness or tingling in your extremities/buttocks/groin, fevers, or any other concerns or change in symptoms.  It was a pleasure caring for you today.

## 2023-03-08 NOTE — ED Triage Notes (Signed)
C/O right lower back pain today.  C/O pain worsening with deep breath.  Denies injury.

## 2023-03-08 NOTE — ED Provider Notes (Signed)
[facility] Provider Note    Event Date/Time   First MD Initiated Contact with Patient 03/08/23 1322     (approximate)   History   Back Pain   HPI  Antonio Mcclain is a 42 y.o. male who presents today for evaluation of right low back pain.  Patient reports that he was cleaning the pool yesterday and woke up this morning feeling sore in the area.  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  However there is no acute injury.  He has not had any paresthesias or weakness in his legs.  No saddle anesthesia.  No abdominal pain.  The pain does not radiate.  No chest pain or shortness of breath.  No history of blood clots.  No history of kidney stones.  No urinary symptoms.  No hematuria.  There are no problems to display for this patient.         Physical Exam   Triage Vital Signs: ED Triage Vitals  Encounter Vitals Group     BP 03/08/23 1227 (!) 142/97     Systolic BP Percentile --      Diastolic BP Percentile --      Pulse Rate 03/08/23 1227 63     Resp 03/08/23 1227 18     Temp 03/08/23 1227 97.9 F (36.6 C)     Temp Source 03/08/23 1227 Oral     SpO2 03/08/23 1227 100 %     Weight 03/08/23 1229 212 lb (96.2 kg)     Height 03/08/23 1229 6\' 3"  (1.905 m)     Head Circumference --      Peak Flow --      Pain Score 03/08/23 1234 8     Pain Loc --      Pain Education --      Exclude from Growth Chart --     Most recent vital signs: Vitals:   03/08/23 1227  BP: (!) 142/97  Pulse: 63  Resp: 18  Temp: 97.9 F (36.6 C)  SpO2: 100%    Physical Exam Vitals and nursing note reviewed.  Constitutional:      General: Awake and alert. No acute distress.    Appearance: Normal appearance. The patient is normal weight.  HENT:     Head: Normocephalic and atraumatic.     Mouth: Mucous membranes are moist.  Eyes:     General: PERRL. Normal EOMs        Right eye: No discharge.         Left eye: No discharge.     Conjunctiva/sclera: Conjunctivae normal.  Cardiovascular:     Rate and Rhythm: Normal rate and regular rhythm.     Pulses: Normal pulses.  Pulmonary:     Effort: Pulmonary effort is normal. No respiratory distress.     Breath sounds: Normal breath sounds.  Abdominal:     Abdomen is soft. There is no abdominal tenderness. No rebound or guarding. No distention. Musculoskeletal:        General: No swelling. Normal range of motion.     Cervical back: Normal range of motion and neck supple.  Back: No midline tenderness.  Tenderness palpation to right lumbar paraspinal muscle area with palpable muscle spasm.  Strength and sensation 5/5 to bilateral lower extremities. Normal great toe extension against resistance. Normal sensation throughout feet. Normal patellar reflexes. Negative SLR and opposite SLR bilaterally. Negative  FABER test Skin:    General: Skin is warm and dry.     Capillary Refill: Capillary refill takes less than 2 seconds.     Findings: No rash.  Neurological:     Mental Status: The patient is awake and alert.      ED Results / Procedures / Treatments   Labs (all labs ordered are listed, but only abnormal results are displayed) Labs Reviewed - No data to display   EKG     RADIOLOGY     PROCEDURES:  Critical Care performed:   Procedures   MEDICATIONS ORDERED IN ED: Medications  lidocaine (LIDODERM) 5 % 1 patch (1 patch Transdermal Patch Applied 03/08/23 1343)  ketorolac (TORADOL) 15 MG/ML injection 15 mg (15 mg Intramuscular Given 03/08/23 1342)     IMPRESSION / MDM / ASSESSMENT AND PLAN / ED COURSE  I reviewed the triage vital signs and the nursing notes.   Differential diagnosis includes, but is not limited to, spasm, strain, less likely UTI or nephrolithiasis.  Patient is awake and alert, hemodynamically stable and afebrile.  He has 5 out of 5 strength with intact sensation to extensor hallucis dorsiflexion and  plantarflexion of bilateral lower extremities with normal patellar reflexes bilaterally. Most likely etiology at this point is muscle strain vs spasm. No red flags to indicate patient is at risk for more auspicious process that would require urgent/emergent spinal imaging or subspecialty evaluation at this time. No major trauma, no midline tenderness, no history or physical exam findings to suggest cauda equina syndrome or spinal cord compression. No focal neurological deficits on exam. No constitutional symptoms or history of immunosuppression or IVDA to suggest potential for epidural abscess. Not anticoagulated, no history of bleeding diastasis to suggest risk for epidural hematoma. No chronic steroid use or advanced age or history of malignancy to suggest proclivity towards pathological fracture.  No abdominal pain or flank pain to suggest kidney stone, no history of kidney stone.  No fever or dysuria or CVAT to suggest pyelonephritis.  No chest pain, back pain, shortness of breath, neurological deficits, to suggest vascular catastrophe, and pulses are equal in all 4 extremities.  No tachycardia or hypoxia, no chest pain or upper back pain, no clinical signs or symptoms of DVT, to suggest PE as a source of his symptoms.  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  Discussed care instructions and return precautions with patient. Recommended close outpatient follow-up for re-evaluation. Patient agrees with plan of care. Will treat the patient symptomatically as needed for pain control. Will discharge patient to take these medications and return for any worsening or different pain or development of any neurologic symptoms. Educated patient regarding expected time course for back pain to improve and recommended very close outpatient follow-up.  Patient understands and agrees with plan.  He was discharged in stable  condition.   Patient's presentation is most consistent with acute complicated illness / injury requiring diagnostic workup.   FINAL CLINICAL IMPRESSION(S) / ED DIAGNOSES   Final diagnoses:  Acute right-sided low back pain without sciatica  Muscle spasm of back     Rx / DC Orders   ED Discharge Orders          Ordered    naproxen (NAPROSYN) 500 MG tablet  2 times daily with meals        03/08/23 1354    lidocaine (LIDODERM) 5 %  Every 12 hours        03/08/23 1354             Note:  This document was prepared using Dragon voice recognition software and may include unintentional dictation errors.   Keturah Shavers 03/08/23 1430    Sharman Cheek, MD 03/08/23 1440

## 2023-03-13 DIAGNOSIS — R5383 Other fatigue: Secondary | ICD-10-CM | POA: Diagnosis not present

## 2023-03-13 DIAGNOSIS — J309 Allergic rhinitis, unspecified: Secondary | ICD-10-CM | POA: Diagnosis not present

## 2023-03-13 DIAGNOSIS — Z6826 Body mass index (BMI) 26.0-26.9, adult: Secondary | ICD-10-CM | POA: Diagnosis not present

## 2023-05-10 ENCOUNTER — Emergency Department: Payer: 59

## 2023-05-10 ENCOUNTER — Encounter: Payer: Self-pay | Admitting: Emergency Medicine

## 2023-05-10 ENCOUNTER — Other Ambulatory Visit: Payer: Self-pay

## 2023-05-10 ENCOUNTER — Emergency Department
Admission: EM | Admit: 2023-05-10 | Discharge: 2023-05-10 | Disposition: A | Discharge status: Home / Self Care | Payer: 59 | Attending: Emergency Medicine | Admitting: Emergency Medicine

## 2023-05-10 DIAGNOSIS — Z20822 Contact with and (suspected) exposure to covid-19: Secondary | ICD-10-CM | POA: Diagnosis not present

## 2023-05-10 DIAGNOSIS — R509 Fever, unspecified: Secondary | ICD-10-CM | POA: Diagnosis not present

## 2023-05-10 DIAGNOSIS — J069 Acute upper respiratory infection, unspecified: Secondary | ICD-10-CM | POA: Insufficient documentation

## 2023-05-10 DIAGNOSIS — R059 Cough, unspecified: Secondary | ICD-10-CM | POA: Diagnosis not present

## 2023-05-10 DIAGNOSIS — R079 Chest pain, unspecified: Secondary | ICD-10-CM | POA: Diagnosis not present

## 2023-05-10 LAB — RESP PANEL BY RT-PCR (RSV, FLU A&B, COVID)  RVPGX2
Influenza A by PCR: NEGATIVE
Influenza B by PCR: NEGATIVE
Resp Syncytial Virus by PCR: NEGATIVE
SARS Coronavirus 2 by RT PCR: NEGATIVE

## 2023-05-10 MED ORDER — ACETAMINOPHEN 325 MG PO TABS
650.0000 mg | ORAL_TABLET | Freq: Once | ORAL | Status: AC
  Administered 2023-05-10: 650 mg via ORAL
  Filled 2023-05-10: qty 2

## 2023-05-10 NOTE — ED Provider Notes (Signed)
[facility] Provider Note    Event Date/Time   First MD Initiated Contact with Patient 05/10/23 1142     (approximate)   History   Nasal Congestion and Cough   HPI  Antonio Mcclain is a 42 y.o. male with no reported past medical history presents today for evaluation of cough, nasal congestion, sneezing, scratchy throat for the past 2 days.  He denies chest pain or shortness of breath.  He reports that he feels very rundown.  No abdominal pain, nausea, vomiting, diarrhea.  He thinks that he had a fever earlier, though he has not had any antipyretics today.  No chills.  There are no problems to display for this patient.         Physical Exam   Triage Vital Signs: ED Triage Vitals  Encounter Vitals Group     BP 05/10/23 1043 (!) 126/103     Systolic BP Percentile --      Diastolic BP Percentile --      Pulse Rate 05/10/23 1043 70     Resp 05/10/23 1043 14     Temp 05/10/23 1043 98.6 F (37 C)     Temp Source 05/10/23 1043 Oral     SpO2 05/10/23 1043 100 %     Weight 05/10/23 1044 210 lb (95.3 kg)     Height 05/10/23 1044 6\' 2"  (1.88 m)     Head Circumference --      Peak Flow --      Pain Score 05/10/23 1043 5     Pain Loc --      Pain Education --      Exclude from Growth Chart --     Most recent vital signs: Vitals:   05/10/23 1043  BP: (!) 126/103  Pulse: 70  Resp: 14  Temp: 98.6 F (37 C)  SpO2: 100%    Physical Exam Vitals and nursing note reviewed.  Constitutional:      General: Awake and alert. No acute distress.    Appearance: Normal appearance. The patient is normal weight.  HENT:     Head: Normocephalic and atraumatic.     Mouth: Mucous membranes are moist. Uvula midline.  No tonsillar exudate.  No soft palate fluctuance.  No trismus.  No voice change.  No sublingual swelling.  No tender cervical lymphadenopathy.  No nuchal rigidity Nasal congestion present Eyes:     General: PERRL. Normal EOMs        Right eye:  No discharge.        Left eye: No discharge.     Conjunctiva/sclera: Conjunctivae normal.  Cardiovascular:     Rate and Rhythm: Normal rate and regular rhythm.     Pulses: Normal pulses.  Pulmonary:     Effort: Pulmonary effort is normal. No respiratory distress.     Breath sounds: Normal breath sounds.  Abdominal:     Abdomen is soft. There is no abdominal tenderness. No rebound or guarding. No distention. Musculoskeletal:        General: No swelling. Normal range of motion.     Cervical back: Normal range of motion and neck supple.  Skin:    General: Skin is warm and dry.     Capillary Refill: Capillary refill takes less than 2 seconds.     Findings: No rash.  Neurological:     Mental Status: The patient is awake and alert.      ED Results / Procedures / Treatments  Labs (all labs ordered are listed, but only abnormal results are displayed) Labs Reviewed  RESP PANEL BY RT-PCR (RSV, FLU A&B, COVID)  [id number]     EKG     RADIOLOGY I independently reviewed and interpreted imaging and agree with radiologists findings.     PROCEDURES:  Critical Care performed:   Procedures   MEDICATIONS ORDERED IN ED: Medications  acetaminophen (TYLENOL) tablet 650 mg (650 mg Oral Given 05/10/23 1156)     IMPRESSION / MDM / ASSESSMENT AND PLAN / ED COURSE  I reviewed the triage vital signs and the nursing notes.   Differential diagnosis includes, but is not limited to, COVID, flu, RSV, bronchitis, pneumonia.  Patient is awake and alert, hemodynamically stable and afebrile.  He is able to speak easily in complete sentences and demonstrates no increased work of breathing.  There is no accessory muscle use.  COVID, flu, RSV swab obtained in triage is negative.  Chest x-ray does not reveal evidence of a lobar pneumonia.  However, given the increased incidence of mycoplasma/atypical pneumonia, will treat for possible atypical pneumonia.  Patient is in agreement with this plan.   There is no chest pain, pleurisy, hemoptysis, calf pain or leg swelling, tachycardia or hypoxia, previous PE or DVT, to suggest pulmonary embolism as a source of her symptoms today.  We discussed symptomatic management and return precautions.  Patient understands and agrees with plan.  He was discharged in stable condition.   Patient's presentation is most consistent with acute complicated illness / injury requiring diagnostic workup.      FINAL CLINICAL IMPRESSION(S) / ED DIAGNOSES   Final diagnoses:  Upper respiratory tract infection, unspecified type     Rx / DC Orders   ED Discharge Orders     None        Note:  This document was prepared using Dragon voice recognition software and may include unintentional dictation errors.   Jackelyn Hoehn, PA-C 05/10/23 1404    Corena Herter, MD 05/10/23 1558

## 2023-05-10 NOTE — ED Triage Notes (Signed)
Says Tuesday started with congetsion, sneezing and cough.  Non productive.  Went to [facility] clinic and had covid/flu neg.  Says he feels worse today,  not able to sleep due to cough.  At home had fever 100.3

## 2023-05-10 NOTE — Discharge Instructions (Signed)
COVID, flu, RSV swab is negative.  Chest x-ray does not reveal pneumonia.  You likely have another viral etiology.  You may continue to take Tylenol/ibuprofen per package instructions to help with your symptoms.  Please return for any new, worsening, or change in symptoms or other concerns.  It was a pleasure caring for you today.

## 2023-06-11 ENCOUNTER — Emergency Department
Admission: EM | Admit: 2023-06-11 | Discharge: 2023-06-11 | Disposition: A | Discharge status: Home / Self Care | Payer: 59 | Attending: Emergency Medicine | Admitting: Emergency Medicine

## 2023-06-11 ENCOUNTER — Other Ambulatory Visit: Payer: Self-pay

## 2023-06-11 DIAGNOSIS — J209 Acute bronchitis, unspecified: Secondary | ICD-10-CM | POA: Insufficient documentation

## 2023-06-11 DIAGNOSIS — Z20822 Contact with and (suspected) exposure to covid-19: Secondary | ICD-10-CM | POA: Insufficient documentation

## 2023-06-11 DIAGNOSIS — J069 Acute upper respiratory infection, unspecified: Secondary | ICD-10-CM | POA: Diagnosis not present

## 2023-06-11 DIAGNOSIS — R059 Cough, unspecified: Secondary | ICD-10-CM | POA: Diagnosis not present

## 2023-06-11 LAB — RESP PANEL BY RT-PCR (RSV, FLU A&B, COVID)  RVPGX2
Influenza A by PCR: NEGATIVE
Influenza B by PCR: NEGATIVE
Resp Syncytial Virus by PCR: NEGATIVE
SARS Coronavirus 2 by RT PCR: NEGATIVE

## 2023-06-11 MED ORDER — AZITHROMYCIN 250 MG PO TABS: ORAL_TABLET | ORAL | 0 refills | Status: AC

## 2023-06-11 NOTE — ED Triage Notes (Signed)
 Pt to ED for cough for a few days.

## 2023-06-11 NOTE — ED Provider Notes (Signed)
   [facility] Provider Note    Event Date/Time   First MD Initiated Contact with Patient 06/11/23 [phone]     (approximate)  History   Chief Complaint: Cough  HPI  Antonio Mcclain is a 43 y.o. male with a past medical history of pretension who presents to the emergency department for 3 days of cough congestion chills subjective fever.  According to the patient his brother-in-law was recently visiting who is sick with an upper respiratory infection.  He states for the last 3 days he has been coughing with congestion subjective fever/chills.  Patient cannot go to work today due to feeling ill so he came to the emergency department.  No chest pain.  No shortness of breath.  Physical Exam   Triage Vital Signs: ED Triage Vitals [06/11/23 0844]  Encounter Vitals Group     BP (!) 158/119     Systolic BP Percentile      Diastolic BP Percentile      Pulse Rate 66     Resp 18     Temp 97.8 F (36.6 C)     Temp src      SpO2 100 %     Weight 210 lb (95.3 kg)     Height 6' 2 (1.88 m)     Head Circumference      Peak Flow      Pain Score 0     Pain Loc      Pain Education      Exclude from Growth Chart     Most recent vital signs: Vitals:   06/11/23 0844  BP: (!) 158/119  Pulse: 66  Resp: 18  Temp: 97.8 F (36.6 C)  SpO2: 100%    General: Awake, no distress.  CV:  Good peripheral perfusion.  Regular rate and rhythm  Resp:  Normal effort.  Equal breath sounds bilaterally.  No wheeze rales or rhonchi. Abd:  No distention. Other:  Occasional cough during examination.   ED Results / Procedures / Treatments   MEDICATIONS ORDERED IN ED: Medications - No data to display   IMPRESSION / MDM / ASSESSMENT AND PLAN / ED COURSE  I reviewed the triage vital signs and the nursing notes.  Patient's presentation is most consistent with acute illness / injury with system symptoms.  Patient presents the emergency department for 3 days of cough congestion and  subjective fever/chills.  Patient's COVID/flu/RSV is negative.  Does have an occasional dry sounding cough throughout my evaluation.  Clear lung sounds.  Reassuring vital signs.  Patient is a daily smoker, suspect likely acute bronchitis.  We will treat with a 5-day course of Zithromax , patient will use Tylenol /ibuprofen  as needed for discomfort.  Patient agreeable to plan of care.  FINAL CLINICAL IMPRESSION(S) / ED DIAGNOSES   Upper respiratory infection Acute bronchitis Note:  This document was prepared using Dragon voice recognition software and may include unintentional dictation errors.   Dorothyann Drivers, MD 06/11/23 1005

## 2023-06-11 NOTE — ED Notes (Signed)
 See triage notes. Patient c/o cough for a few days.

## 2023-06-20 ENCOUNTER — Telehealth: Payer: Self-pay | Admitting: *Deleted

## 2023-06-20 NOTE — Progress Notes (Signed)
 Transition Care Management Follow-up Telephone Call Date of discharge and from where: [facility]  06/11/2023 How have you been since you were released from the hospital? Feeling much better  Any questions or concerns? No  Items Reviewed: Did the pt receive and understand the discharge instructions provided? Yes  Medications obtained and verified? Yes  Other? No  Any new allergies since your discharge? No  Dietary orders reviewed? No Do you have support at home? Yes       Follow up appointments reviewed:  PCP Hospital f/u appt confirmed? No  No appts available at this time might reach out to min clinic if he needs to see someone before they have availability  Are transportation arrangements needed? No If their condition worsens, is the pt aware to call PCP or go to the Emergency Dept.? Yes Was the patient provided with contact information for the PCP's office or ED? Yes Was to pt encouraged to call back with questions or concerns? Yes  Shahzaib Azevedo [facility] [place]  Population [facility]  Direct Dial: [phone] Website: [email or web address]

## 2023-10-13 ENCOUNTER — Other Ambulatory Visit: Payer: Self-pay

## 2023-10-13 ENCOUNTER — Emergency Department
Admission: EM | Admit: 2023-10-13 | Discharge: 2023-10-13 | Disposition: A | Discharge status: Home / Self Care | Attending: Emergency Medicine | Admitting: Emergency Medicine

## 2023-10-13 ENCOUNTER — Encounter: Payer: Self-pay | Admitting: Emergency Medicine

## 2023-10-13 DIAGNOSIS — S39012A Strain of muscle, fascia and tendon of lower back, initial encounter: Secondary | ICD-10-CM | POA: Diagnosis not present

## 2023-10-13 DIAGNOSIS — M25511 Pain in right shoulder: Secondary | ICD-10-CM | POA: Diagnosis not present

## 2023-10-13 DIAGNOSIS — X503XXA Overexertion from repetitive movements, initial encounter: Secondary | ICD-10-CM | POA: Diagnosis not present

## 2023-10-13 DIAGNOSIS — I10 Essential (primary) hypertension: Secondary | ICD-10-CM | POA: Diagnosis not present

## 2023-10-13 DIAGNOSIS — Y990 Civilian activity done for income or pay: Secondary | ICD-10-CM | POA: Insufficient documentation

## 2023-10-13 DIAGNOSIS — S3992XA Unspecified injury of lower back, initial encounter: Secondary | ICD-10-CM | POA: Diagnosis present

## 2023-10-13 MED ORDER — CYCLOBENZAPRINE HCL 5 MG PO TABS: 5.0000 mg | ORAL_TABLET | Freq: Three times a day (TID) | ORAL | 0 refills | Status: DC | PRN

## 2023-10-13 MED ORDER — CYCLOBENZAPRINE HCL 10 MG PO TABS
5.0000 mg | ORAL_TABLET | Freq: Once | ORAL | Status: AC
  Administered 2023-10-13: 5 mg via ORAL
  Filled 2023-10-13: qty 1

## 2023-10-13 MED ORDER — OXYCODONE-ACETAMINOPHEN 5-325 MG PO TABS
1.0000 | ORAL_TABLET | ORAL | Status: DC | PRN
  Administered 2023-10-13: 1 via ORAL
  Filled 2023-10-13: qty 1

## 2023-10-13 NOTE — ED Triage Notes (Signed)
  Patient comes in with R sided shoulder and back pain that started this morning after waking up.  Patient states he was at his job last night and had to lift several objects that were close to 200 lbs each.  Patient states he has tightening and spasms in R shoulder and back.  Denies any numbness or tingling at this time.  Took ibuprofen  around 1400.  Hx of pinched nerve and herniated cervical disk.  Pain 8/10, aching/throbbing.

## 2023-10-13 NOTE — Discharge Instructions (Addendum)
 Follow up with your primary care provider.  Practice good body mechanics while lifting heavy objects at work.

## 2023-10-13 NOTE — ED Provider Notes (Signed)
 [facility] Emergency Department Provider Note     Event Date/Time   First MD Initiated Contact with Patient 10/13/23 2207     (approximate)   History   Back Pain and Spasms   HPI  Antonio Mcclain is a 43 y.o. male with a past medical history of HTN presents to the ED for evaluation of right-sided shoulder and lower back pain with onset of today.  Patient reports he lifts heavy objects daily at work and reports get constant muscle spasms in these areas.  Has tried Tylenol  and ibuprofen  with some relief.  He denies injury or trauma.  He denies loss of bladder or bowel control and saddle anesthesia.  Patient reports pain has resolved.     Physical Exam   Triage Vital Signs: ED Triage Vitals [10/13/23 2120]  Encounter Vitals Group     BP (!) 148/120     Systolic BP Percentile      Diastolic BP Percentile      Pulse Rate 78     Resp 18     Temp 98.4 F (36.9 C)     Temp Source Oral     SpO2 100 %     Weight 210 lb (95.3 kg)     Height 6\' 2"  (1.88 m)     Head Circumference      Peak Flow      Pain Score 8     Pain Loc      Pain Education      Exclude from Growth Chart     Most recent vital signs: Vitals:   10/13/23 2120  BP: (!) 148/120  Pulse: 78  Resp: 18  Temp: 98.4 F (36.9 C)  SpO2: 100%    General: Well appearing. Alert and oriented. INAD.     Head:  NCAT.  CV:  Good peripheral perfusion.  RESP:  Normal effort.  BACK:  Spinous process is midline without deformity or tenderness. MSK:   Full ROM in all joints. No swelling, deformity or tenderness.  NEURO: Cranial nerves intact. No focal deficits. Sensation and motor function intact. 5/5 muscle strength of & LE. Gait is steady.   ED Results / Procedures / Treatments   Labs (all labs ordered are listed, but only abnormal results are displayed) Labs Reviewed - No data to display  No results found.  PROCEDURES:  Critical Care performed: No  Procedures  MEDICATIONS  ORDERED IN ED: Medications  oxyCODONE -acetaminophen  (PERCOCET/ROXICET) 5-325 MG per tablet 1 tablet (1 tablet Oral Given 10/13/23 2131)  cyclobenzaprine  (FLEXERIL ) tablet 5 mg (5 mg Oral Given 10/13/23 2253)   IMPRESSION / MDM / ASSESSMENT AND PLAN / ED COURSE  I reviewed the triage vital signs and the nursing notes.                                 43 y.o. male presents to the emergency department for evaluation and treatment of  back pain. See HPI for further details.   Differential diagnosis includes, but is not limited to muscle spasm, strain  Patient's presentation is most consistent with acute complicated illness / injury requiring diagnostic workup.  Patient is alert and oriented.  He is hemodynamically stable.  No back red flag signs.  Percocet provided in triage patient reports improvement in pain.  Will give muscle relaxer while in ED and send outpatient prescription.  Patient is in stable condition for  discharge home and PCP follow-up.  Work note provided.  ED return precautions discussed.   FINAL CLINICAL IMPRESSION(S) / ED DIAGNOSES   Final diagnoses:  Strain of lumbar region, initial encounter     Rx / DC Orders   ED Discharge Orders          Ordered    cyclobenzaprine  (FLEXERIL ) 5 MG tablet  3 times daily PRN        10/13/23 2250            Note:  This document was prepared using Dragon voice recognition software and may include unintentional dictation errors.    Phyllis Breeze, Jolleen Seman A, PA-C 10/13/23 2323    Kandee Orion, MD 10/13/23 [phone]

## 2023-11-05 ENCOUNTER — Encounter: Payer: Self-pay | Admitting: *Deleted

## 2023-11-05 ENCOUNTER — Other Ambulatory Visit: Payer: Self-pay

## 2023-11-05 DIAGNOSIS — X58XXXA Exposure to other specified factors, initial encounter: Secondary | ICD-10-CM | POA: Insufficient documentation

## 2023-11-05 DIAGNOSIS — M62830 Muscle spasm of back: Secondary | ICD-10-CM | POA: Diagnosis not present

## 2023-11-05 DIAGNOSIS — S161XXA Strain of muscle, fascia and tendon at neck level, initial encounter: Secondary | ICD-10-CM | POA: Diagnosis not present

## 2023-11-05 DIAGNOSIS — M542 Cervicalgia: Secondary | ICD-10-CM | POA: Diagnosis present

## 2023-11-05 NOTE — ED Triage Notes (Signed)
 Pt ambulatory to triage.  Pt has neck pain since yesterday.  Pain radiates in between shoulder blades.  No known injury  pt alert.

## 2023-11-06 ENCOUNTER — Emergency Department
Admission: EM | Admit: 2023-11-06 | Discharge: 2023-11-06 | Disposition: A | Discharge status: Home / Self Care | Attending: Emergency Medicine | Admitting: Emergency Medicine

## 2023-11-06 DIAGNOSIS — M62838 Other muscle spasm: Secondary | ICD-10-CM

## 2023-11-06 DIAGNOSIS — S161XXA Strain of muscle, fascia and tendon at neck level, initial encounter: Secondary | ICD-10-CM

## 2023-11-06 MED ORDER — KETOROLAC TROMETHAMINE 30 MG/ML IJ SOLN
30.0000 mg | Freq: Once | INTRAMUSCULAR | Status: AC
  Administered 2023-11-06: 30 mg via INTRAMUSCULAR
  Filled 2023-11-06: qty 1

## 2023-11-06 MED ORDER — ACETAMINOPHEN 500 MG PO TABS
1000.0000 mg | ORAL_TABLET | Freq: Once | ORAL | Status: AC
  Administered 2023-11-06: 1000 mg via ORAL
  Filled 2023-11-06: qty 2

## 2023-11-06 MED ORDER — METHOCARBAMOL 500 MG PO TABS: 500.0000 mg | ORAL_TABLET | Freq: Four times a day (QID) | ORAL | 0 refills | Status: AC

## 2023-11-06 MED ORDER — LIDOCAINE 5 % EX PTCH
1.0000 | MEDICATED_PATCH | CUTANEOUS | Status: DC
  Administered 2023-11-06: 1 via TRANSDERMAL
  Filled 2023-11-06: qty 1

## 2023-11-06 MED ORDER — LIDOCAINE 5 % EX PTCH: 1.0000 | MEDICATED_PATCH | CUTANEOUS | 0 refills | Status: AC

## 2023-11-06 MED ORDER — MUSCLE RUB 10-15 % EX CREA: 1.0000 | TOPICAL_CREAM | CUTANEOUS | 0 refills | Status: AC | PRN

## 2023-11-06 NOTE — Discharge Instructions (Signed)
 Take acetaminophen  650 mg and ibuprofen  400 mg every 6 hours for pain.  Take with food. Take medications as prescribed.   Thank you for choosing us  for your health care today!  Please see your primary doctor this week for a follow up appointment.   If you have any new, worsening, or unexpected symptoms call your doctor right away or come back to the emergency department for reevaluation.  It was my pleasure to care for you today.   Arron Large Margery Sheets, MD

## 2023-11-06 NOTE — ED Provider Notes (Signed)
 [facility] Provider Note    Event Date/Time   First MD Initiated Contact with Patient 11/06/23 0100     (approximate)   History   Neck Pain   HPI  Antonio Mcclain is a 43 y.o. male   Past medical history of healthy young man who presents to the emergency department with bilateral trapezius and base of neck pain.  He works doing extensive manual labor and had an particularly intense labor yesterday yesterday with heavy material that he was working with all day, using his upper extremities.  He has soreness to bilateral shoulder trapezius and base of neck.  There was no direct trauma.  He has no neurologic symptoms like focal weakness or paresthesias.   External Medical Documents Reviewed: Orthopedics appointment from 2022 when he was diagnosed with cervical radiculopathy      Physical Exam   Triage Vital Signs: ED Triage Vitals  Encounter Vitals Group     BP 11/05/23 2307 (!) 159/107     Systolic BP Percentile --      Diastolic BP Percentile --      Pulse Rate 11/05/23 2307 83     Resp 11/05/23 2307 18     Temp 11/05/23 2307 98.2 F (36.8 C)     Temp Source 11/05/23 2307 Oral     SpO2 11/05/23 2307 100 %     Weight 11/05/23 2308 220 lb (99.8 kg)     Height 11/05/23 2308 6\' 3"  (1.905 m)     Head Circumference --      Peak Flow --      Pain Score 11/05/23 2308 8     Pain Loc --      Pain Education --      Exclude from Growth Chart --     Most recent vital signs: Vitals:   11/05/23 2307 11/06/23 0146  BP: (!) 159/107 (!) 147/107  Pulse: 83 85  Resp: 18 18  Temp: 98.2 F (36.8 C)   SpO2: 100% 100%    General: Awake, no distress.  CV:  Good peripheral perfusion.  Resp:  Normal effort.  Abd:  No distention.  Other:  Awake alert comfortable neck supple full range of motion hypertensive otherwise vital signs normal.  Bilateral paraspinal base of neck tenderness to palpation and tightness in the trapezius muscles.  No midline tenderness  step-off or deformity.  Sensation intact in the upper arms and lower extremities.   ED Results / Procedures / Treatments   Labs (all labs ordered are listed, but only abnormal results are displayed) Labs Reviewed - No data to display   PROCEDURES:  Critical Care performed: No  Procedures   MEDICATIONS ORDERED IN ED: Medications  lidocaine  (LIDODERM ) 5 % 1 patch (1 patch Transdermal Patch Applied 11/06/23 0141)  ketorolac  (TORADOL ) 30 MG/ML injection 30 mg (30 mg Intramuscular Given 11/06/23 0141)  acetaminophen  (TYLENOL ) tablet 1,000 mg (1,000 mg Oral Given 11/06/23 0141)    IMPRESSION / MDM / ASSESSMENT AND PLAN / ED COURSE  I reviewed the triage vital signs and the nursing notes.                                Patient's presentation is most consistent with acute complicated illness / injury requiring diagnostic workup.  Differential diagnosis includes, but is not limited to, cervical strain, muscle spasms, trapezius strain/spasm, considered but less likely fractures dislocation or cord compromise  The patient is on the cardiac monitor to evaluate for evidence of arrhythmia and/or significant heart rate changes.  MDM:    Well-appearing patient with overuse injury muscle spasms and tightness to the neck and trapezius muscles for which have given medications in the emergency department prescriptions for muscle relaxants etc.  I doubt fractures dislocations given no direct trauma benign exam as above, no evidence of cord pathologies, anticipatory guidance given and will be discharged with PMD follow-up.       FINAL CLINICAL IMPRESSION(S) / ED DIAGNOSES   Final diagnoses:  Acute strain of neck muscle, initial encounter  Trapezius muscle spasm     Rx / DC Orders   ED Discharge Orders          Ordered    lidocaine  (LIDODERM ) 5 %  Every 24 hours        11/06/23 0138    Menthol-Methyl Salicylate (MUSCLE RUB) 10-15 % CREA  As needed        11/06/23 0138     methocarbamol (ROBAXIN) 500 MG tablet  4 times daily        11/06/23 0138             Note:  This document was prepared using Dragon voice recognition software and may include unintentional dictation errors.    Buell Carmin, MD 11/06/23 [phone]

## 2023-12-17 ENCOUNTER — Emergency Department
Admission: EM | Admit: 2023-12-17 | Discharge: 2023-12-17 | Disposition: A | Discharge status: Home / Self Care | Attending: Emergency Medicine | Admitting: Emergency Medicine

## 2023-12-17 ENCOUNTER — Other Ambulatory Visit: Payer: Self-pay

## 2023-12-17 DIAGNOSIS — G5602 Carpal tunnel syndrome, left upper limb: Secondary | ICD-10-CM | POA: Diagnosis not present

## 2023-12-17 DIAGNOSIS — M7712 Lateral epicondylitis, left elbow: Secondary | ICD-10-CM | POA: Diagnosis not present

## 2023-12-17 DIAGNOSIS — M25522 Pain in left elbow: Secondary | ICD-10-CM | POA: Diagnosis present

## 2023-12-17 MED ORDER — PREDNISONE 20 MG PO TABS
60.0000 mg | ORAL_TABLET | Freq: Once | ORAL | Status: AC
  Administered 2023-12-17: 60 mg via ORAL
  Filled 2023-12-17: qty 3

## 2023-12-17 MED ORDER — PREDNISONE 10 MG PO TABS: 10.0000 mg | ORAL_TABLET | Freq: Every day | ORAL | 0 refills | Status: DC

## 2023-12-17 NOTE — Discharge Instructions (Addendum)
 Please wear carpal tunnel brace.  Start prednisone  taper tomorrow.  Avoid lifting with the palm down with the left hand.  Follow-up with orthopedics in 1 week if no improvement of symptoms.  Wear carpal tunnel brace day and night, you may remove to shower.

## 2023-12-17 NOTE — ED Provider Notes (Addendum)
  EMERGENCY DEPARTMENT AT [facility] Provider Note   CSN: [id number] Arrival date & time: 12/17/23  1512     Patient presents with: Elbow Pain   Antonio Mcclain is a 43 y.o. male.  Presents to the emergency department for evaluation of left elbow pain, numbness and tingling in the left hand.  He has had greater than 1 week of pain and discomfort along the lateral epicondyle after he bumped his elbow on a doorway.  He also has been experiencing some numbness and tingling in the thumb index and middle finger worse when he is asleep.  He has not tried a carpal tunnel brace.  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  He send hard time grasping and gripping due to pain in his elbow.  He has not been take any medications for his symptoms.  Denies any weakness      Prior to Admission medications   Medication Sig Start Date End Date Taking? Authorizing Provider  predniSONE  (DELTASONE ) 10 MG tablet Take 1 tablet (10 mg total) by mouth daily. 6,5,4,3,2,1 six day taper 12/17/23  Yes Lennix Rotundo C, PA-C  albuterol  (VENTOLIN  HFA) 108 (90 Base) MCG/ACT inhaler Inhale 2 puffs into the lungs every 6 (six) hours as needed for wheezing or shortness of breath. 09/22/21   Antonetta Penne Ruth, PA  cyclobenzaprine  (FLEXERIL ) 5 MG tablet Take 1 tablet (5 mg total) by mouth 3 (three) times daily as needed. 10/13/23   Margrette, Myah A, PA-C  fluticasone  (FLONASE ) 50 MCG/ACT nasal spray Place 1 spray into both nostrils 2 (two) times daily. 07/18/21 08/17/21  Menshew, Candida LULLA Kings, PA-C  Menthol-Methyl Salicylate (MUSCLE RUB) 10-15 % CREA Apply 1 Application topically as needed. 11/06/23   Cyrena Mylar, MD    Allergies: Bee pollen, Coconut (cocos nucifera), and Tramadol    Review of Systems  Updated Vital Signs BP (!) 139/93 (BP Location: Right Arm)   Pulse 66   Temp 98.4 F (36.9 C) (Oral)   Resp 18   Ht 6' 2 (1.88 m)   Wt 97.5 kg    SpO2 99%   BMI 27.60 kg/m   Physical Exam Constitutional:      Appearance: He is well-developed.  HENT:     Head: Normocephalic and atraumatic.     Nose: No rhinorrhea.  Eyes:     Conjunctiva/sclera: Conjunctivae normal.  Cardiovascular:     Rate and Rhythm: Normal rate.  Pulmonary:     Effort: Pulmonary effort is normal. No respiratory distress.  Musculoskeletal:        General: Normal range of motion.     Cervical back: Normal range of motion.     Comments: Full range of motion of the left elbow no swelling warmth redness.  Tender along the lateral epicondyle nontender along the medial epicondyle.  Painful resisted wrist extension of the left elbow.  Positive Phalen sign left wrist.  Negative Tinel's.  No thenar atrophy.  Grip strength normal.  5 out of 5 strength with bicep and tricep resistance.  2+ radial pulse.  Skin:    General: Skin is warm.     Findings: No rash.  Neurological:     General: No focal deficit present.     Mental Status: He is alert and oriented to person, place, and time.     Cranial Nerves: No cranial nerve deficit.  Psychiatric:        Behavior: Behavior  normal.        Thought Content: Thought content normal.     (all labs ordered are listed, but only abnormal results are displayed) Labs Reviewed - No data to display  EKG: None  Radiology: No results found.   Procedures   Medications Ordered in the ED  predniSONE  (DELTASONE ) tablet 60 mg (has no administration in time range)                                    Medical Decision Making Risk Prescription drug management.   43 year old male with pain and discomfort left elbow with numbness and tingling in the left hand.  He works a repetitive job and has been having pain mostly with grasping and gripping along the lateral epicondyle with history and exam findings consistent with lateral epicondylitis.  He also has some mild intermittent numbness and tingling in his thumb index and middle  finger with positive Phalen sign.  Numbness and tingling worse with sleeping.  We will place into a carpal tunnel brace and place him on a 6-day steroid taper.  Creatinine reviewed and slightly elevated so we will avoid NSAIDs.  He is given a note to refrain from work as he performs repetitive activity.  He will follow-up with orthopedics in 1 week if persistent symptoms Final diagnoses:  Carpal tunnel syndrome, left  Lateral epicondylitis, left elbow    ED Discharge Orders          Ordered    predniSONE  (DELTASONE ) 10 MG tablet  Daily        12/17/23 1728               Charlene Debby BROCKS, PA-C 12/17/23 1736    Charlene Debby BROCKS, PA-C 12/17/23 1737    Malvina Alm DASEN, MD 12/17/23 2208

## 2023-12-17 NOTE — ED Triage Notes (Signed)
 Pt to ER for c/o left elbow pain that extends to hand. States weakness in left hand and arm. States pain began Thursday.

## 2023-12-26 ENCOUNTER — Emergency Department
Admission: EM | Admit: 2023-12-26 | Discharge: 2023-12-26 | Disposition: A | Discharge status: Home / Self Care | Attending: Emergency Medicine | Admitting: Emergency Medicine

## 2023-12-26 DIAGNOSIS — I10 Essential (primary) hypertension: Secondary | ICD-10-CM | POA: Diagnosis not present

## 2023-12-26 DIAGNOSIS — X503XXA Overexertion from repetitive movements, initial encounter: Secondary | ICD-10-CM | POA: Insufficient documentation

## 2023-12-26 DIAGNOSIS — S4992XA Unspecified injury of left shoulder and upper arm, initial encounter: Secondary | ICD-10-CM | POA: Diagnosis present

## 2023-12-26 DIAGNOSIS — S46912A Strain of unspecified muscle, fascia and tendon at shoulder and upper arm level, left arm, initial encounter: Secondary | ICD-10-CM | POA: Insufficient documentation

## 2023-12-26 DIAGNOSIS — Y990 Civilian activity done for income or pay: Secondary | ICD-10-CM | POA: Insufficient documentation

## 2023-12-26 DIAGNOSIS — M7522 Bicipital tendinitis, left shoulder: Secondary | ICD-10-CM | POA: Insufficient documentation

## 2023-12-26 NOTE — ED Triage Notes (Addendum)
 Pt c/o L shoulder pain x1 day.  Pain score 10/10.  Pt has not taken anything for pain this morning.  Pt has been seen several times for pain in L arm.  Pt reports working a strenuous job w/ repetitive movements.  Pt was able to work last night, but it exacerbated his pain.      Pt has not followed-up w/ referral.    Pt reports taking prescribed medications last night.

## 2023-12-26 NOTE — ED Provider Notes (Signed)
 [facility] Provider Note    Event Date/Time   First MD Initiated Contact with Patient 12/26/23 [phone]     (approximate)   History   Shoulder Pain   HPI  Antonio Mcclain is a 43 y.o. male history of hypertension presents emergency department complaining of left shoulder pain.  Patient's had problems with his left wrist and elbow but that feels better now.  Now complaining of left shoulder pain.  Increased pain with movement and with rolling over on it at night.  States unable to sleep on his left side.  Denies numbness or tingling.  Does repetitive motions at work.      Physical Exam   Triage Vital Signs: ED Triage Vitals  Encounter Vitals Group     BP 12/26/23 0835 (!) 147/99     Girls Systolic BP Percentile --      Girls Diastolic BP Percentile --      Boys Systolic BP Percentile --      Boys Diastolic BP Percentile --      Pulse Rate 12/26/23 0835 74     Resp 12/26/23 0835 19     Temp 12/26/23 0835 97.8 F (36.6 C)     Temp Source 12/26/23 0835 Oral     SpO2 12/26/23 0835 99 %     Weight 12/26/23 0836 218 lb (98.9 kg)     Height 12/26/23 0836 6' 2 (1.88 m)     Head Circumference --      Peak Flow --      Pain Score 12/26/23 0842 10     Pain Loc --      Pain Education --      Exclude from Growth Chart --     Most recent vital signs: Vitals:   12/26/23 0835  BP: (!) 147/99  Pulse: 74  Resp: 19  Temp: 97.8 F (36.6 C)  SpO2: 99%     General: Awake, no distress.   CV:  Good peripheral perfusion.  Resp:  Normal effort.  Abd:  No distention.   Other:  Left shoulder tender along the bicep tendon and at the joint space, decreased range of motion secondary discomfort   ED Results / Procedures / Treatments   Labs (all labs ordered are listed, but only abnormal results are displayed) Labs Reviewed - No data to display   EKG     RADIOLOGY     PROCEDURES:   Procedures  Critical Care:  no Chief Complaint  Patient  presents with   Shoulder Pain      MEDICATIONS ORDERED IN ED: Medications - No data to display   IMPRESSION / MDM / ASSESSMENT AND PLAN / ED COURSE  I reviewed the triage vital signs and the nursing notes.                              Differential diagnosis includes, but is not limited to, bicep tendinitis, capsulitis, shoulder strain, overuse repetitive use  Patient's presentation is most consistent with acute illness / injury with system symptoms.   Patient has been seen here several times for the same type of complaints.  Has not followed up with orthopedics.  I did explain to him that we are going to continue to do the same treatment over and over out of the emergency department but he needs to see orthopedics if he wants to get well.  Did place him in a  sling for comfort.  Use ice.  Continue his medications.  Follow-up with [facility] as previously instructed.  I did tell him they have a walk-in clinic that he could go to today.  Patient is off for the next 4 days to did not offer a work note.  He was discharged stable condition.      FINAL CLINICAL IMPRESSION(S) / ED DIAGNOSES   Final diagnoses:  Biceps tendinitis of left upper extremity  Strain of left shoulder, initial encounter     Rx / DC Orders   ED Discharge Orders     None        Note:  This document was prepared using Dragon voice recognition software and may include unintentional dictation errors.    Gasper Devere ORN, PA-C 12/26/23 1007    Arlander Charleston, MD 12/26/23 [phone]

## 2024-02-17 IMAGING — CR DG CHEST 2V
2 series · 2 of 2 positions shown · non-contrast
Comparison: April 24, 2020

CLINICAL DATA: Chest pain and cough.

EXAM:
CHEST - 2 VIEW

[chest pa]
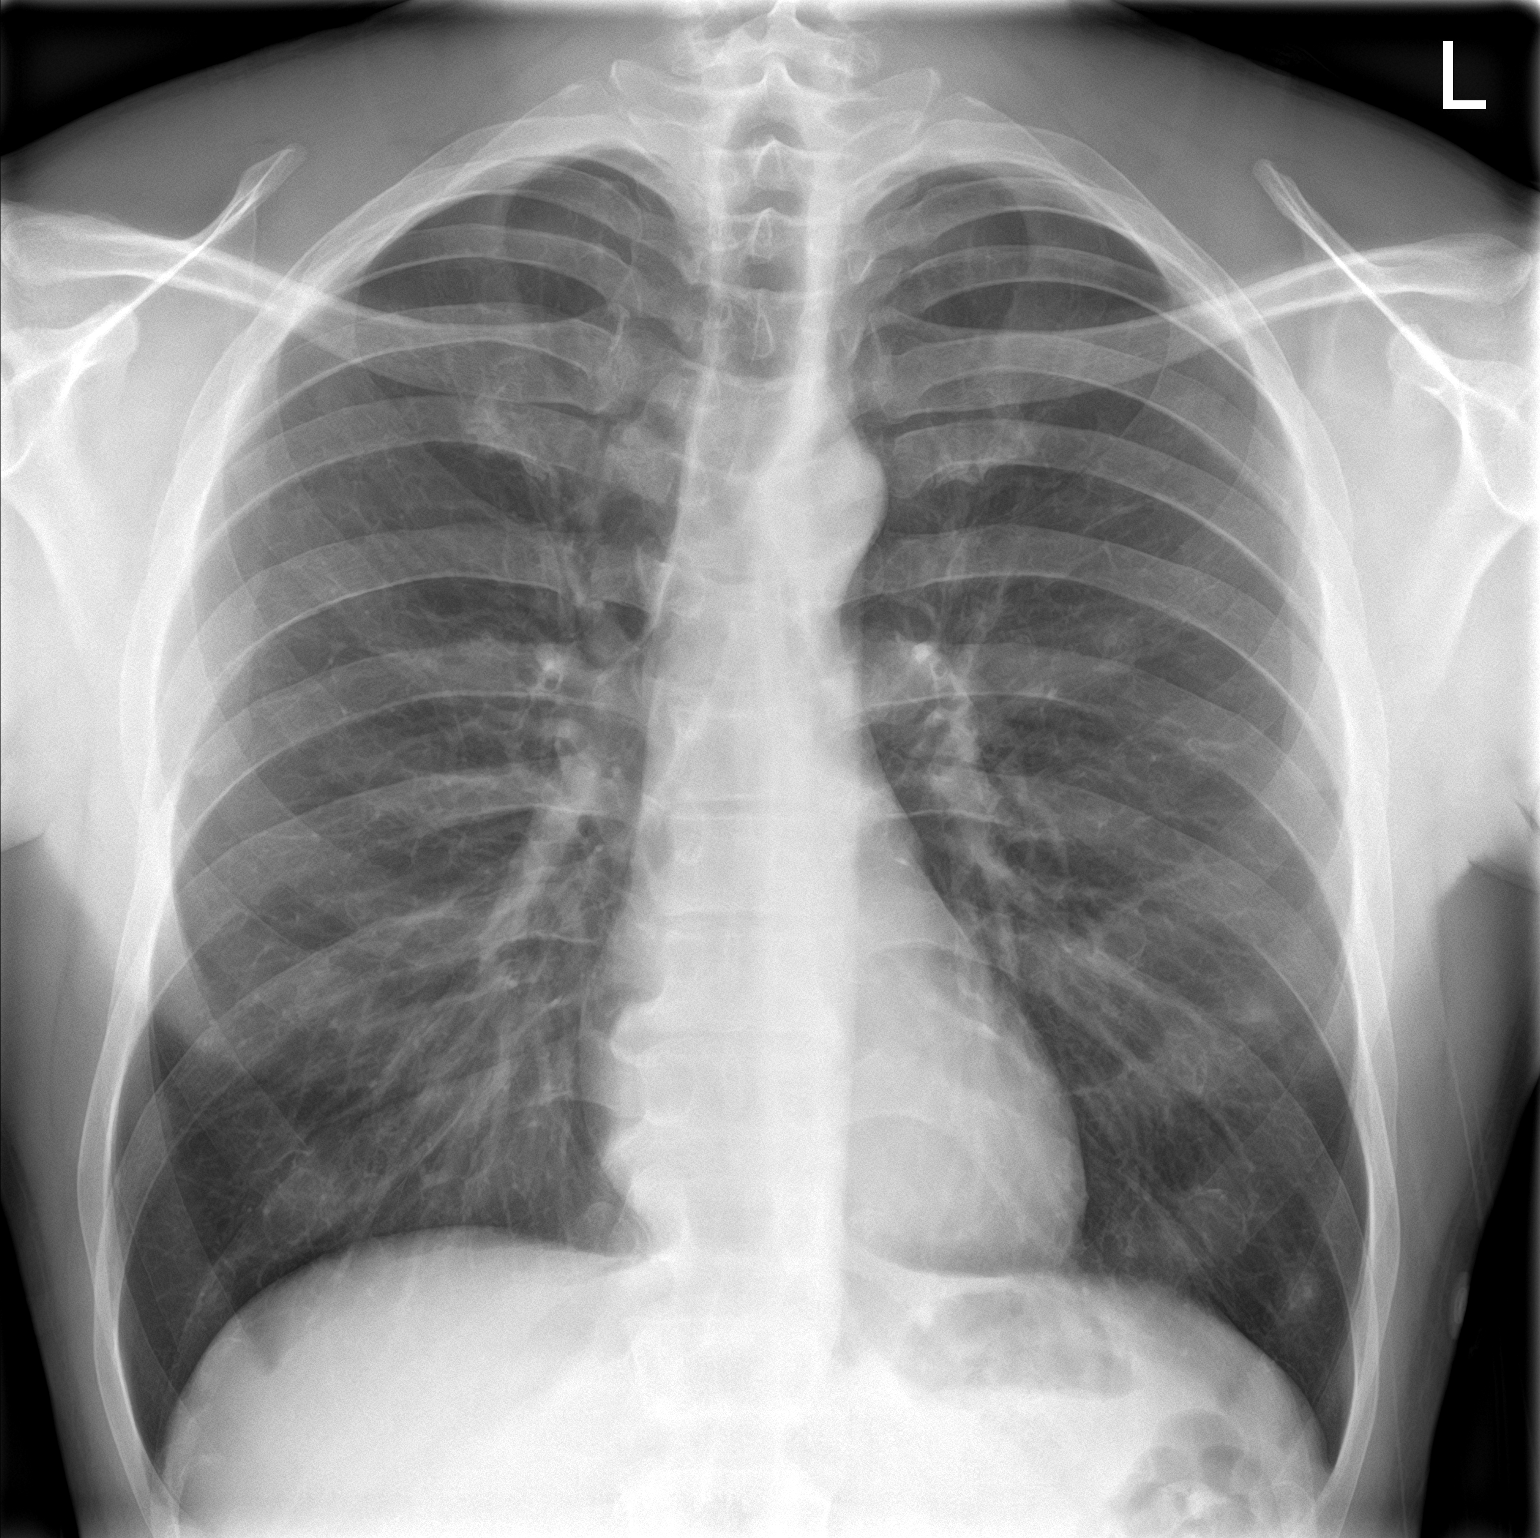

[chest lat]
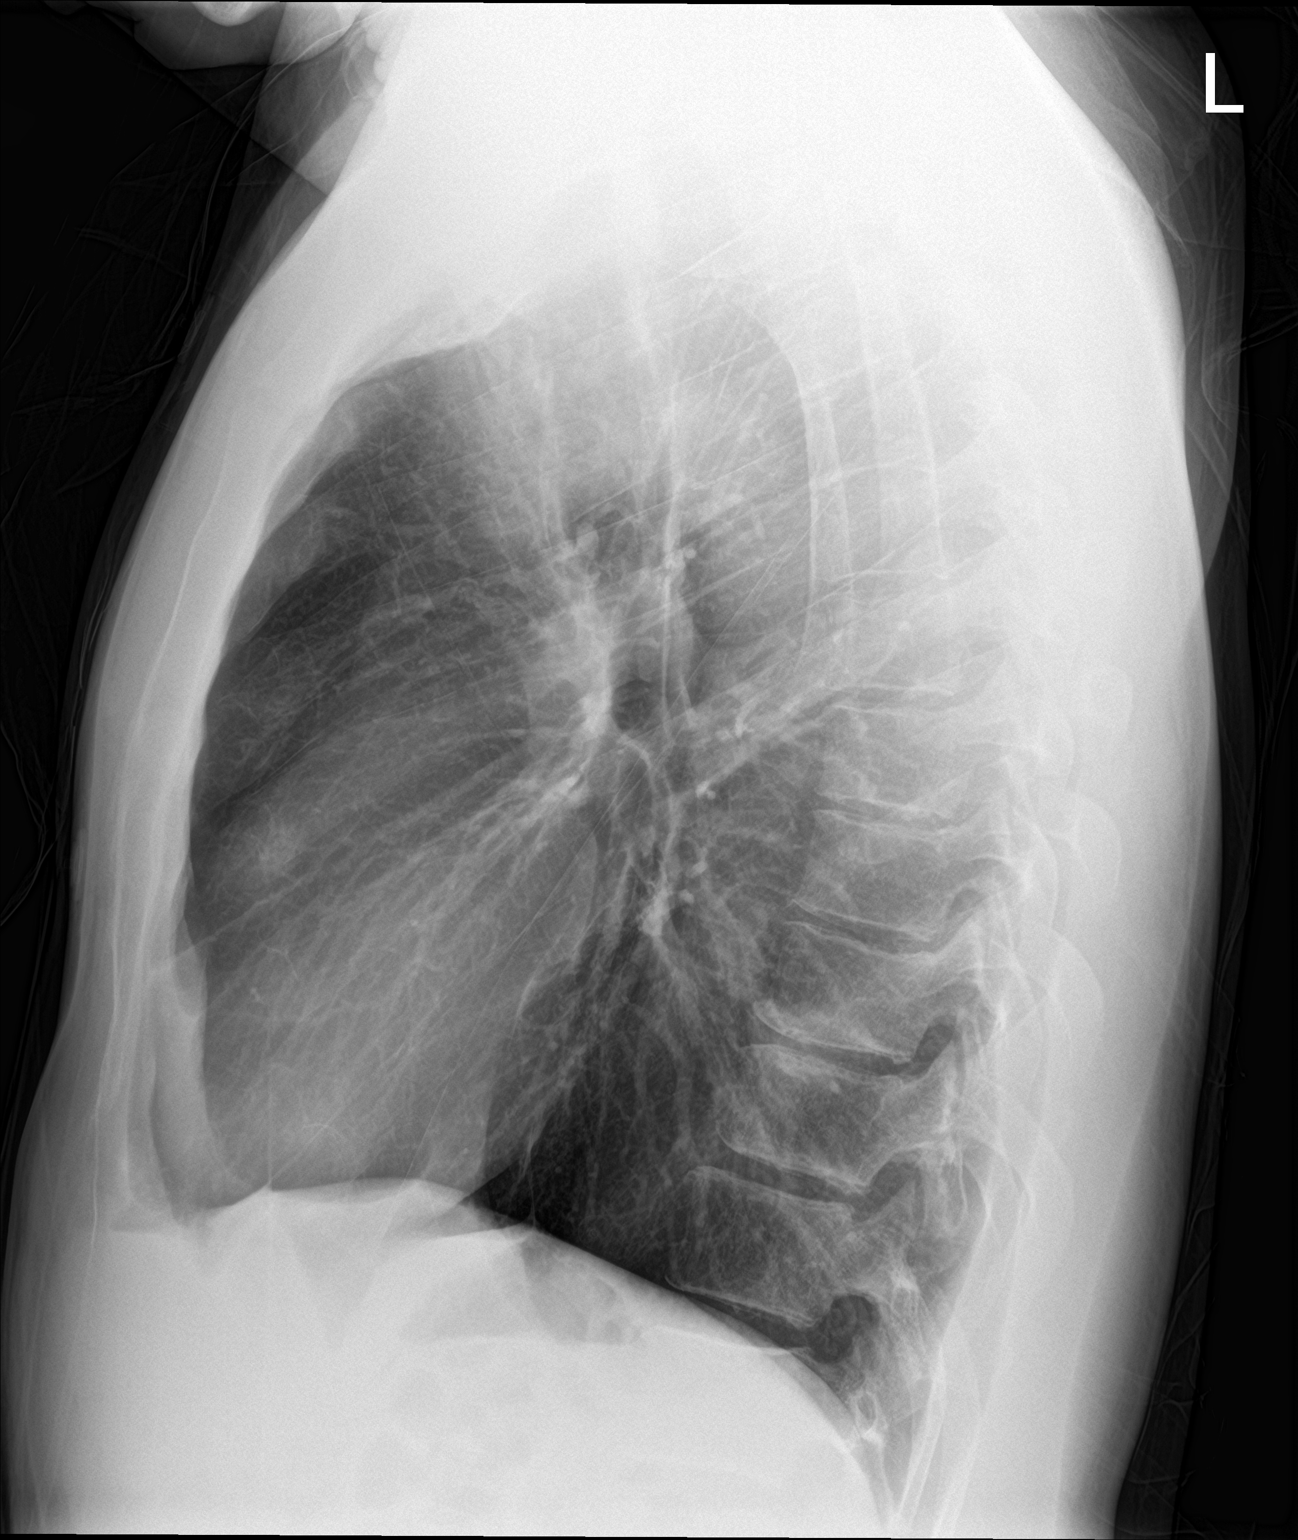

[2 of 2 positions shown; findings below may reference images not displayed]

FINDINGS: The heart size and mediastinal contours are within normal limits.
Both lungs are clear. The visualized skeletal structures are
unremarkable.
IMPRESSION: No active cardiopulmonary disease.

## 2024-02-26 ENCOUNTER — Emergency Department

## 2024-02-26 ENCOUNTER — Other Ambulatory Visit: Payer: Self-pay

## 2024-02-26 ENCOUNTER — Emergency Department
Admission: EM | Admit: 2024-02-26 | Discharge: 2024-02-26 | Disposition: A | Discharge status: Home / Self Care | Attending: Emergency Medicine | Admitting: Emergency Medicine

## 2024-02-26 DIAGNOSIS — R059 Cough, unspecified: Secondary | ICD-10-CM | POA: Diagnosis present

## 2024-02-26 DIAGNOSIS — J069 Acute upper respiratory infection, unspecified: Secondary | ICD-10-CM | POA: Insufficient documentation

## 2024-02-26 LAB — RESP PANEL BY RT-PCR (RSV, FLU A&B, COVID)  RVPGX2
Influenza A by PCR: NEGATIVE
Influenza B by PCR: NEGATIVE
Resp Syncytial Virus by PCR: NEGATIVE
SARS Coronavirus 2 by RT PCR: NEGATIVE

## 2024-02-26 LAB — GROUP A STREP BY PCR: Group A Strep by PCR: NOT DETECTED

## 2024-02-26 MED ORDER — ACETAMINOPHEN 325 MG PO TABS
650.0000 mg | ORAL_TABLET | Freq: Once | ORAL | Status: AC
  Administered 2024-02-26: 650 mg via ORAL
  Filled 2024-02-26: qty 2

## 2024-02-26 NOTE — Discharge Instructions (Signed)
 Your COVID, flu, RSV and strep test are negative.  Your chest x-ray does not show pneumonia.  You likely have another viral etiology of your symptoms today.  You may continue to take Tylenol /ibuprofen  per package instructions to help with your symptoms.  Please return for any new, worsening, or change in symptoms or other concerns.  It was a pleasure caring for you today.

## 2024-02-26 NOTE — ED Provider Notes (Signed)
 [facility] Provider Note    Event Date/Time   First MD Initiated Contact with Patient 02/26/24 [phone]     (approximate)   History   Cough and Nausea   HPI  Antonio Mcclain is a 43 y.o. male who presents for evaluation of fatigue, nausea, sore throat, coughing since 2 days ago.  He also reports diarrhea and decreased appetite.  No fevers or chills.  There are no active problems to display for this patient.         Physical Exam   Triage Vital Signs: ED Triage Vitals  Encounter Vitals Group     BP 02/26/24 0841 (!) 159/105     Girls Systolic BP Percentile --      Girls Diastolic BP Percentile --      Boys Systolic BP Percentile --      Boys Diastolic BP Percentile --      Pulse Rate 02/26/24 0841 94     Resp 02/26/24 0841 16     Temp 02/26/24 0841 98.5 F (36.9 C)     Temp Source 02/26/24 0841 Oral     SpO2 02/26/24 0841 100 %     Weight 02/26/24 0840 220 lb (99.8 kg)     Height 02/26/24 0840 6' 2 (1.88 m)     Head Circumference --      Peak Flow --      Pain Score 02/26/24 0839 0     Pain Loc --      Pain Education --      Exclude from Growth Chart --     Most recent vital signs: Vitals:   02/26/24 0841  BP: (!) 159/105  Pulse: 94  Resp: 16  Temp: 98.5 F (36.9 C)  SpO2: 100%    Physical Exam Vitals and nursing note reviewed.  Constitutional:      General: Awake and alert. No acute distress.    Appearance: Normal appearance. The patient is normal weight.  HENT:     Head: Normocephalic and atraumatic.     Mouth: Mucous membranes are moist. Uvula midline.  No tonsillar exudate.  No soft palate fluctuance.  No trismus.  No voice change.  No sublingual swelling.  No tender cervical lymphadenopathy.  No nuchal rigidity Nasal congestion present. Eyes:     General: PERRL. Normal EOMs        Right eye: No discharge.        Left eye: No discharge.     Conjunctiva/sclera: Conjunctivae normal.  Cardiovascular:     Rate and  Rhythm: Normal rate and regular rhythm.     Pulses: Normal pulses.  Pulmonary:     Effort: Pulmonary effort is normal. No respiratory distress.     Breath sounds: Normal breath sounds.  Abdominal:     Abdomen is soft. There is no abdominal tenderness. No rebound or guarding. No distention. Musculoskeletal:        General: No swelling. Normal range of motion.     Cervical back: Normal range of motion and neck supple.  Skin:    General: Skin is warm and dry.     Capillary Refill: Capillary refill takes less than 2 seconds.     Findings: No rash.  Neurological:     Mental Status: The patient is awake and alert.      ED Results / Procedures / Treatments   Labs (all labs ordered are listed, but only abnormal results are displayed) Labs Reviewed  GROUP A  STREP BY PCR  RESP PANEL BY RT-PCR (RSV, FLU A&B, COVID)  [id number]     EKG     RADIOLOGY I independently reviewed and interpreted imaging and agree with radiologists findings.     PROCEDURES:  Critical Care performed:   Procedures   MEDICATIONS ORDERED IN ED: Medications  acetaminophen  (TYLENOL ) tablet 650 mg (650 mg Oral Given 02/26/24 1147)     IMPRESSION / MDM / ASSESSMENT AND PLAN / ED COURSE  I reviewed the triage vital signs and the nursing notes.   Differential diagnosis includes, but is not limited to, COVID, influenza, RSV, pneumonia, bronchitis, other URI.  Patient is awake and alert, hemodynamically stable and afebrile.  He is nontoxic in appearance and has a normal oxygen saturation 100% on room air.  Lungs are clear to auscultation bilaterally.  COVID, flu, RSV and strep swabs obtained in triage are negative.  Chest x-ray reveals no cardiopulmonary abnormality.  Symptoms are most consistent with other viral etiology.  We discussed symptomatic management as well as return precautions.  Patient understands and agrees with plan.  He was discharged in stable condition.   Patient's presentation is most  consistent with acute complicated illness / injury requiring diagnostic workup.    FINAL CLINICAL IMPRESSION(S) / ED DIAGNOSES   Final diagnoses:  Upper respiratory tract infection, unspecified type     Rx / DC Orders   ED Discharge Orders     None        Note:  This document was prepared using Dragon voice recognition software and may include unintentional dictation errors.   Carlicia Leavens E, PA-C 02/26/24 1206    Viviann Pastor, MD 02/27/24 1141

## 2024-02-26 NOTE — ED Triage Notes (Signed)
 Pt to ED for fatigue, nausea, coughing since 2 days, and had diarrhea yesterday and low appetite. Also throat is irritated since yesterday and it feels slimy when he swallows. Respirations unlabored.

## 2024-03-21 ENCOUNTER — Other Ambulatory Visit: Payer: Self-pay

## 2024-03-21 ENCOUNTER — Emergency Department
Admission: EM | Admit: 2024-03-21 | Discharge: 2024-03-21 | Disposition: A | Discharge status: Home / Self Care | Attending: Emergency Medicine | Admitting: Emergency Medicine

## 2024-03-21 DIAGNOSIS — B9789 Other viral agents as the cause of diseases classified elsewhere: Secondary | ICD-10-CM | POA: Insufficient documentation

## 2024-03-21 DIAGNOSIS — J069 Acute upper respiratory infection, unspecified: Secondary | ICD-10-CM | POA: Diagnosis not present

## 2024-03-21 DIAGNOSIS — I10 Essential (primary) hypertension: Secondary | ICD-10-CM | POA: Insufficient documentation

## 2024-03-21 DIAGNOSIS — R059 Cough, unspecified: Secondary | ICD-10-CM | POA: Diagnosis present

## 2024-03-21 LAB — RESP PANEL BY RT-PCR (RSV, FLU A&B, COVID)  RVPGX2
Influenza A by PCR: NEGATIVE
Influenza B by PCR: NEGATIVE
Resp Syncytial Virus by PCR: NEGATIVE
SARS Coronavirus 2 by RT PCR: NEGATIVE

## 2024-03-21 MED ORDER — BENZONATATE 100 MG PO CAPS: 100.0000 mg | ORAL_CAPSULE | Freq: Three times a day (TID) | ORAL | 0 refills | Status: DC | PRN

## 2024-03-21 NOTE — ED Notes (Signed)
 See triage note  Presents with body aches and cough  States sxs' started last pm  Afebrile on arrival

## 2024-03-21 NOTE — ED Provider Notes (Signed)
 [facility] Provider Note    Event Date/Time   First MD Initiated Contact with Patient 03/21/24 [phone]     (approximate)   History   Cough   HPI  Antonio Mcclain is a 43 y.o. male with PMH of hypertension presents for evaluation of URI symptoms.  Patient states that they began last night.  He endorses cough, congestion, body aches, fatigue and loss of taste.      Physical Exam   Triage Vital Signs: ED Triage Vitals [03/21/24 0844]  Encounter Vitals Group     BP (!) 148/109     Girls Systolic BP Percentile      Girls Diastolic BP Percentile      Boys Systolic BP Percentile      Boys Diastolic BP Percentile      Pulse Rate 65     Resp 20     Temp 98.2 F (36.8 C)     Temp src      SpO2 100 %     Weight 220 lb (99.8 kg)     Height 6' 2 (1.88 m)     Head Circumference      Peak Flow      Pain Score 7     Pain Loc      Pain Education      Exclude from Growth Chart     Most recent vital signs: Vitals:   03/21/24 0844  BP: (!) 148/109  Pulse: 65  Resp: 20  Temp: 98.2 F (36.8 C)  SpO2: 100%   General: Awake, comfortable appearing, coughing. CV:  Good peripheral perfusion.  RRR. Resp:  Normal effort.  CTAB. Abd:  No distention.  Other:  Oral mucous membranes are moist, pharynx is erythematous but no tonsillar enlargement or exudates.   ED Results / Procedures / Treatments   Labs (all labs ordered are listed, but only abnormal results are displayed) Labs Reviewed  RESP PANEL BY RT-PCR (RSV, FLU A&B, COVID)  [id number]    PROCEDURES:  Critical Care performed: No  Procedures   MEDICATIONS ORDERED IN ED: Medications - No data to display   IMPRESSION / MDM / ASSESSMENT AND PLAN / ED COURSE  I reviewed the triage vital signs and the nursing notes.                             43 year old male presents for evaluation of URI symptoms.  Vital signs are stable aside from elevated blood pressure.  Patient a bit uncomfortable  appearing on exam.  Differential diagnosis includes, but is not limited to, viral URI like flu, COVID or RSV, pharyngitis, bronchitis.  Patient's presentation is most consistent with acute, uncomplicated illness.  Will check patient for flu, COVID and RSV.  Recommended symptomatic management using over-the-counter cold medication.  Will encourage patient to take Coricidin HBP due to his history of high blood pressure.  Will also send some cough medicine. Encouraged rest, fluids and eating extra fruits and vegetables.  Will plan to give patient a note for work if needed.  Patient will be stable for outpatient management.  Clinical Course as of 03/21/24 1022  Fri Mar 21, 2024  1022 Resp panel by RT-PCR (RSV, Flu A&B, Covid) Anterior Nasal Swab Negative. [LD]    Clinical Course User Index [LD] Cleaster Tinnie LABOR, PA-C     FINAL CLINICAL IMPRESSION(S) / ED DIAGNOSES   Final diagnoses:  Viral URI with cough  Rx / DC Orders   ED Discharge Orders          Ordered    guaiFENesin-codeine  100-10 MG/5ML syrup  Every 6 hours PRN        03/21/24 1020    benzonatate  (TESSALON  PERLES) 100 MG capsule  3 times daily PRN        03/21/24 1020             Note:  This document was prepared using Dragon voice recognition software and may include unintentional dictation errors.   Cleaster Tinnie LABOR, PA-C 03/21/24 1022    Levander Slate, MD 03/21/24 1453

## 2024-03-21 NOTE — Discharge Instructions (Addendum)
 Tested negative for flu, COVID and RSV today.  You likely have another viral illness which will resolve on its own with time.  You do not need an antibiotic.  You can take over-the-counter cold medicine as needed to manage your symptoms.  If you are taking combination cold medicine keep in mind that this often contains Tylenol  so if you need additional medication for body aches or fever control please take Motrin  or ibuprofen .  Your symptoms should resolve with time, if you have had symptoms for greater than 10 days please be evaluated by another healthcare provider as at this point it may have developed into a bacterial infection which requires a different treatment.  Return to the emergency department with worsening symptoms.  Since you have a history of high blood pressure, I recommend taking Coricidin HBP for treatment of your cold symptoms, this is an over-the-counter cold medication specifically for people with high blood pressure.  I have also sent some cough medication for you to take.  The cough syrup with codeine  will make you sleepy so do not take this medication and drive.

## 2024-03-21 NOTE — ED Triage Notes (Signed)
 Pt to ED for cough, loss taste, body aches started last night.

## 2024-04-14 ENCOUNTER — Emergency Department
Admission: EM | Admit: 2024-04-14 | Discharge: 2024-04-14 | Disposition: A | Discharge status: Home / Self Care | Attending: Emergency Medicine | Admitting: Emergency Medicine

## 2024-04-14 ENCOUNTER — Emergency Department

## 2024-04-14 ENCOUNTER — Encounter: Payer: Self-pay | Admitting: Emergency Medicine

## 2024-04-14 ENCOUNTER — Other Ambulatory Visit: Payer: Self-pay

## 2024-04-14 DIAGNOSIS — R0981 Nasal congestion: Secondary | ICD-10-CM | POA: Insufficient documentation

## 2024-04-14 DIAGNOSIS — R051 Acute cough: Secondary | ICD-10-CM | POA: Diagnosis not present

## 2024-04-14 DIAGNOSIS — I10 Essential (primary) hypertension: Secondary | ICD-10-CM | POA: Diagnosis not present

## 2024-04-14 DIAGNOSIS — R059 Cough, unspecified: Secondary | ICD-10-CM | POA: Diagnosis present

## 2024-04-14 DIAGNOSIS — R638 Other symptoms and signs concerning food and fluid intake: Secondary | ICD-10-CM | POA: Diagnosis not present

## 2024-04-14 LAB — RESP PANEL BY RT-PCR (RSV, FLU A&B, COVID)  RVPGX2
Influenza A by PCR: NEGATIVE
Influenza B by PCR: NEGATIVE
Resp Syncytial Virus by PCR: NEGATIVE
SARS Coronavirus 2 by RT PCR: NEGATIVE

## 2024-04-14 MED ORDER — IPRATROPIUM-ALBUTEROL 0.5-2.5 (3) MG/3ML IN SOLN
3.0000 mL | Freq: Once | RESPIRATORY_TRACT | Status: AC
  Administered 2024-04-14: 3 mL via RESPIRATORY_TRACT
  Filled 2024-04-14: qty 3

## 2024-04-14 MED ORDER — BENZONATATE 100 MG PO CAPS: 100.0000 mg | ORAL_CAPSULE | Freq: Three times a day (TID) | ORAL | 0 refills | Status: AC | PRN

## 2024-04-14 NOTE — ED Provider Notes (Signed)
 [facility] Emergency Department Provider Note     Event Date/Time   First MD Initiated Contact with Patient 04/14/24 1027     (approximate)   History   Cough   HPI  Antonio Mcclain is a 43 y.o. male with a past medical history of HTN presents to the ED for evaluation of productive cough described as green mucus, loss of appetite and nasal congestion x 2 days.  Unknown sick contacts.  Denies fevers.  Denies chest pain, shortness of breath or abdominal pain. Patient endorses daily tobacco use.      Physical Exam   Triage Vital Signs: ED Triage Vitals [04/14/24 1024]  Encounter Vitals Group     BP (!) 150/101     Girls Systolic BP Percentile      Girls Diastolic BP Percentile      Boys Systolic BP Percentile      Boys Diastolic BP Percentile      Pulse Rate 66     Resp 17     Temp 98.2 F (36.8 C)     Temp Source Oral     SpO2 100 %     Weight (!) 320 lb (145.2 kg)     Height 6' 2 (1.88 m)     Head Circumference      Peak Flow      Pain Score 6     Pain Loc      Pain Education      Exclude from Growth Chart     Most recent vital signs: Vitals:   04/14/24 1024  BP: (!) 150/101  Pulse: 66  Resp: 17  Temp: 98.2 F (36.8 C)  SpO2: 100%    General Awake, no distress.  Nontoxic-appearing HEENT NCAT. PERRL. EOMI. No rhinorrhea. Mucous membranes are moist.  CV:  Good peripheral perfusion. RRR RESP:  Normal effort.  LCTAB ABD:  No distention.  Other:  Oropharynx is clear.  No tonsil enlargement.  No erythema or exudates.  Uvula is midline.   ED Results / Procedures / Treatments   Labs (all labs ordered are listed, but only abnormal results are displayed) Labs Reviewed  RESP PANEL BY RT-PCR (RSV, FLU A&B, COVID)  [id number]   RADIOLOGY  I personally viewed and evaluated these images as part of my medical decision making, as well as reviewing the written report by the radiologist.  ED Provider Interpretation: No focal  consolidation.  DG Chest 1 View Result Date: 04/14/2024 CLINICAL DATA:  Cough. EXAM: CHEST  1 VIEW COMPARISON:  Chest radiograph dated 02/26/2024. FINDINGS: The heart size and mediastinal contours are within normal limits. Both lungs are clear. The visualized skeletal structures are unremarkable. IMPRESSION: No active disease. Electronically Signed   By: Vanetta Chou M.D.   On: 04/14/2024 11:46    PROCEDURES:  Critical Care performed: No  Procedures   MEDICATIONS ORDERED IN ED: Medications  ipratropium-albuterol  (DUONEB) 0.5-2.5 (3) MG/3ML nebulizer solution 3 mL (3 mLs Nebulization Given 04/14/24 1051)     IMPRESSION / MDM / ASSESSMENT AND PLAN / ED COURSE  I reviewed the triage vital signs and the nursing notes.                              Clinical Course as of 04/14/24 1154  Mon Apr 14, 2024  1149 Resp panel by RT-PCR (RSV, Flu A&B, Covid) Anterior Nasal Swab Negative [MH]  1151 DG Chest  1 View IMPRESSION: No active disease.   [MH]    Clinical Course User Index [MH] Margrette Rebbeca LABOR, PA-C    43 y.o. male presents to the emergency department for evaluation and treatment of acute URI symptoms. See HPI for further details.   Differential diagnosis includes, but is not limited to viral URI, PNA, COVID  Patient's presentation is most consistent with acute complicated illness / injury requiring diagnostic workup.  Patient is alert and oriented.  Noted elevated BP of 150/101, otherwise vital signs are within normal limits.  SpO2 100%.  Physical exam findings are as stated above and overall benign.  Normal lung exam.  Plan to await respiratory panel obtained in triage, order chest x-ray to rule out PNA and administer DuoNeb, then reassess.  Patient admits to not being on hypertensive medication at this time.  He has had a discussion with his primary care provider for medication management, however he was able to control his blood pressure with lifestyle modification.   Thorough discussion of close follow-up with primary care to discuss blood pressure was had and patient verbalized understanding.  ----------------------------------------- 11:54 AM on 04/14/2024 -----------------------------------------  Workup is reassuring.  Chest x-ray is normal.  Send Tessalon  Perles to pharmacy as patient reports this has worked for him in the past.  Work note provided at discharge.  He is in stable condition for discharge home.  Symptomatic care treatment education provided for at home.  He verbalized understanding.  Advised close follow-up with primary care provider for BP management.  ED return precautions discussed.  FINAL CLINICAL IMPRESSION(S) / ED DIAGNOSES   Final diagnoses:  Acute cough   Rx / DC Orders   ED Discharge Orders          Ordered    benzonatate  (TESSALON ) 100 MG capsule  3 times daily PRN        04/14/24 1152           Note:  This document was prepared using Dragon voice recognition software and may include unintentional dictation errors.    Margrette, Jeaninne Lodico A, PA-C 04/14/24 1155    Bradler, Evan K, MD 04/15/24 1332

## 2024-04-14 NOTE — Discharge Instructions (Addendum)
 You were seen in the emergency department today for a cough. Your respiratory panel which includes COVID, influenza and RSV were negative.  Your chest x-ray is negative.  This is likely a viral upper respiratory infection. Consider SUDAFED (a decongestant medication) from OTC at your local pharmacy  A viral cough may last up to 2-3 weeks.   Take tylenol  or ibuprofen  for pain or fever as directed.   Stay hydrated by drinking plenty of fluids to thin mucus. Get adequate amount of sleep and avoid overexertion. Consider a humidifier at night. Warm teas and a spoonful of honey may help reduce cough frequency. Follow up with your primary care provider as needed.

## 2024-04-14 NOTE — ED Triage Notes (Signed)
 Patient to ED via POV for cough, lack of appetite, and congestion. Ongoing x2 days. Denies fever at home.

## 2024-04-14 NOTE — ED Notes (Signed)
 See triage note  Presents with a 2 day hx of cough and congestion  Afebrile on arrival
